# Patient Record
Sex: Female | Born: 1980
Health system: Southern US, Community
[De-identification: ages and names within clinical notes are randomized; demographics above are authoritative.]

## PROBLEM LIST (undated history)

## (undated) DIAGNOSIS — Z8679 Personal history of other diseases of the circulatory system: Secondary | ICD-10-CM

## (undated) DIAGNOSIS — K5900 Constipation, unspecified: Secondary | ICD-10-CM

## (undated) DIAGNOSIS — F419 Anxiety disorder, unspecified: Secondary | ICD-10-CM

## (undated) DIAGNOSIS — Z1509 Genetic susceptibility to other malignant neoplasm: Secondary | ICD-10-CM

## (undated) DIAGNOSIS — I499 Cardiac arrhythmia, unspecified: Secondary | ICD-10-CM

## (undated) DIAGNOSIS — R011 Cardiac murmur, unspecified: Secondary | ICD-10-CM

## (undated) DIAGNOSIS — Z15068 Genetic susceptibility to other malignant neoplasm of digestive system: Secondary | ICD-10-CM

## (undated) DIAGNOSIS — D259 Leiomyoma of uterus, unspecified: Secondary | ICD-10-CM

## (undated) DIAGNOSIS — E785 Hyperlipidemia, unspecified: Secondary | ICD-10-CM

## (undated) DIAGNOSIS — F418 Other specified anxiety disorders: Secondary | ICD-10-CM

## (undated) DIAGNOSIS — G4700 Insomnia, unspecified: Secondary | ICD-10-CM

## (undated) DIAGNOSIS — K5909 Other constipation: Secondary | ICD-10-CM

## (undated) DIAGNOSIS — K649 Unspecified hemorrhoids: Secondary | ICD-10-CM

## (undated) DIAGNOSIS — Z9289 Personal history of other medical treatment: Secondary | ICD-10-CM

## (undated) DIAGNOSIS — N3946 Mixed incontinence: Secondary | ICD-10-CM

## (undated) DIAGNOSIS — I10 Essential (primary) hypertension: Secondary | ICD-10-CM

## (undated) DIAGNOSIS — R7611 Nonspecific reaction to tuberculin skin test without active tuberculosis: Secondary | ICD-10-CM

## (undated) HISTORY — DX: Anxiety disorder, unspecified: F41.9

## (undated) HISTORY — DX: Constipation, unspecified: K59.00

## (undated) HISTORY — DX: Cardiac murmur, unspecified: R01.1

## (undated) HISTORY — DX: Hyperlipidemia, unspecified: E78.5

## (undated) HISTORY — DX: Cardiac arrhythmia, unspecified: I49.9

## (undated) HISTORY — DX: Nonspecific reaction to tuberculin skin test without active tuberculosis: R76.11

## (undated) HISTORY — DX: Essential (primary) hypertension: I10

## (undated) HISTORY — DX: Genetic susceptibility to other malignant neoplasm: Z15.09

---
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
## 2013-08-29 HISTORY — PX: WISDOM TOOTH EXTRACTION: SHX21

## 2017-05-22 MED FILL — AMLODIPINE BESYLATE 10 MG T: 10 | 30 days supply | Qty: 30 | Fill #0

## 2017-06-27 MED FILL — AMLODIPINE BESYLATE 5 MG TA: 5 | 90 days supply | Qty: 90 | Fill #0

## 2017-08-01 ENCOUNTER — Telehealth: Payer: Self-pay | Admitting: General Practice

## 2017-08-01 NOTE — Telephone Encounter (Signed)
Patient has set a requesting on [facility] web site requesting to become a new patient of Dr.Crawford.   Do you accept?

## 2017-08-04 NOTE — Telephone Encounter (Signed)
Fine

## 2017-08-10 NOTE — Telephone Encounter (Signed)
Spoke with patient, she has set up care at the [facility] location.

## 2017-08-25 MED FILL — AMLODIPINE BESYLATE 10 MG T: 10 | 90 days supply | Qty: 90 | Fill #0

## 2017-08-30 ENCOUNTER — Encounter: Payer: Self-pay | Admitting: Family Medicine

## 2017-08-30 ENCOUNTER — Ambulatory Visit (INDEPENDENT_AMBULATORY_CARE_PROVIDER_SITE_OTHER): Payer: No Typology Code available for payment source | Admitting: Family Medicine

## 2017-08-30 VITALS — BP 138/80 | HR 86 | Temp 98.5°F | Resp 12 | Ht 70.0 in | Wt 143.1 lb

## 2017-08-30 DIAGNOSIS — Z0001 Encounter for general adult medical examination with abnormal findings: Secondary | ICD-10-CM

## 2017-08-30 DIAGNOSIS — F418 Other specified anxiety disorders: Secondary | ICD-10-CM

## 2017-08-30 DIAGNOSIS — E785 Hyperlipidemia, unspecified: Secondary | ICD-10-CM | POA: Diagnosis not present

## 2017-08-30 DIAGNOSIS — Z0000 Encounter for general adult medical examination without abnormal findings: Secondary | ICD-10-CM

## 2017-08-30 DIAGNOSIS — Z131 Encounter for screening for diabetes mellitus: Secondary | ICD-10-CM

## 2017-08-30 DIAGNOSIS — H6123 Impacted cerumen, bilateral: Secondary | ICD-10-CM

## 2017-08-30 DIAGNOSIS — I10 Essential (primary) hypertension: Secondary | ICD-10-CM

## 2017-08-30 MED ORDER — AMLODIPINE BESYLATE 10 MG PO TABS: 10.0000 mg | ORAL_TABLET | Freq: Every day | ORAL | 3 refills | Status: DC

## 2017-08-30 NOTE — Patient Instructions (Addendum)
A few things to remember from today's visit:   Diabetes mellitus screening - Plan: Basic metabolic panel  Hyperlipidemia, unspecified hyperlipidemia type - Plan: Lipid panel  Hypertension, essential, benign - Plan: Basic metabolic panel, TSH  Routine general medical examination at a health care facility  Today you have you routine preventive visit.  At least 150 minutes of moderate exercise per week, daily brisk walking for 15-30 min is a good exercise option. Healthy diet low in saturated (animal) fats and sweets and consisting of fresh fruits and vegetables, lean meats such as fish and white chicken and whole grains.  These are some of recommendations for screening depending of age and risk factors:   - Vaccines:  Tdap vaccine every 10 years.  Shingles vaccine recommended at age 39, could be given after 39 years of age but not sure about insurance coverage.   Pneumonia vaccines:  Prevnar 13 at 65 and Pneumovax at 66. Sometimes Pneumovax is giving earlier if history of smoking, lung disease,diabetes,kidney disease among some.    Screening for diabetes at age 39 and every 3 years.  Cervical cancer prevention:  Pap smear starts at 39 years of age and continues periodically until 39 years old in low risk women. Pap smear every 3 years between 5921 and 39 years old. Pap smear every 3-5 years between women 30 and older if pap smear negative and HPV screening negative.   -Breast cancer: Mammogram: There is disagreement between experts about when to start screening in low risk asymptomatic female but recent recommendations are to start screening at 8540 and not later than 39 years old , every 1-2 years and after 39 yo q 2 years. Screening is recommended until 39 years old but some women can continue screening depending of healthy issues.   Colon cancer screening: starts at 39 years old until 39 years old.  Cholesterol disorder screening at age 39 and every 3 years.  Also  recommended:  1. Dental visit- Brush and floss your teeth twice daily; visit your dentist twice a year. 2. Eye doctor- Get an eye exam at least every 2 years. 3. Helmet use- Always wear a helmet when riding a bicycle, motorcycle, rollerblading or skateboarding. 4. Safe sex- If you may be exposed to sexually transmitted infections, use a condom. 5. Seat belts- Seat belts can save your live; always wear one. 6. Smoke/Carbon Monoxide detectors- These detectors need to be installed on the appropriate level of your home. Replace batteries at least once a year. 7. Skin cancer- When out in the sun please cover up and use sunscreen 15 SPF or higher. 8. Violence- If anyone is threatening or hurting you, please tell your healthcare provider.  9. Drink alcohol in moderation- Limit alcohol intake to one drink or less per day. Never drink and drive.   Please be sure medication list is accurate. If a new problem present, please set up appointment sooner than planned today.

## 2017-08-30 NOTE — Progress Notes (Signed)
HPI:   [name] is a 37 y.o. female, who is here today to establish care.  Former PCP: Dr Jaynie CollinsSchwa. She just moved from [place]  She is a Optometristpediatrician, works for [employer].  Last preventive routine visit: 08/2016.  Chronic medical problems: HTN,HLD,and mild anxiety among some.  Reporting Hx of white coat synd.  Dx with HTN 2 years ago after second child. She also has elevated BP after her first child.  156/102 and 118/70. Her former PCP recommended Xanax 0.5 mg 1/2-1 tab to take before visits and she noted that her BP's was better after taking medication.She does not take Xanax regularly. She takes Propranolol for situational anxiety.  Home BP's: 110'120's/80's.  Last eye exam 08/2015.  She has no concerns today and would like to have her routine CPE. She has an appt with gyn in the next few weeks for her female preventive care.  She lives with her husband and 2 children. She does not exercise regularly but planning on doing so. She follows a healthy diet in general.   Review of Systems  Constitutional: Negative for activity change, appetite change, fatigue, fever and unexpected weight change.  HENT: Negative for dental problem, mouth sores, nosebleeds, sore throat and trouble swallowing.   Eyes: Negative for redness and visual disturbance.  Respiratory: Negative for cough, shortness of breath and wheezing.   Cardiovascular: Negative for chest pain, palpitations and leg swelling.  Gastrointestinal: Negative for abdominal pain, blood in stool, nausea and vomiting.       Negative for changes in bowel habits.  Endocrine: Negative for cold intolerance, heat intolerance, polydipsia, polyphagia and polyuria.  Genitourinary: Negative for decreased urine volume, dysuria, hematuria, vaginal bleeding and vaginal discharge.  Musculoskeletal: Negative for gait problem and myalgias.  Skin: Negative for pallor and rash.  Allergic/Immunologic: Negative  for environmental allergies.  Neurological: Negative for syncope, weakness and headaches.  Hematological: Negative for adenopathy. Does not bruise/bleed easily.  Psychiatric/Behavioral: Negative for confusion, sleep disturbance and suicidal ideas. The patient is nervous/anxious.   All other systems reviewed and are negative.     Current Outpatient Medications on File Prior to Visit  Medication Sig Dispense Refill  . propranolol (INDERAL) 20 MG tablet Take 20 mg by mouth as needed.     No current facility-administered medications on file prior to visit.      Past Medical History:  Diagnosis Date  . Hypertension   . Positive TB test    No Known Allergies  Family History  Problem Relation Age of Onset  . Diabetes Mother   . Hyperlipidemia Mother   . Miscarriages / [place] Mother   . Graves' disease Mother   . Hyperlipidemia Father   . Heart disease Father   . Arthritis Father   . Hyperlipidemia Sister   . Alcohol abuse Maternal Grandmother   . Arthritis Paternal Grandmother   . Cancer Paternal Grandmother     Social History   Socioeconomic History  . Marital status: Married    Spouse name: None  . Number of children: 2  . Years of education: None  . Highest education level: None  Social Needs  . Financial resource strain: None  . Food insecurity - worry: None  . Food insecurity - inability: None  . Transportation needs - medical: None  . Transportation needs - non-medical: None  Occupational History  . None  Tobacco Use  . Smoking status: Never Smoker  . Smokeless tobacco: Never Used  Substance and Sexual Activity  . Alcohol use: Yes  . Drug use: No  . Sexual activity: None  Other Topics Concern  . None  Social History Narrative  . None    Vitals:   08/30/17 1406 08/30/17 1454  BP: (!) 146/96 138/80  Pulse: 86   Resp: 12   Temp: 98.5 F (36.9 C)   SpO2: 100%     Body mass index is 20.54 kg/m.   Physical Exam  Nursing note and vitals  reviewed. Constitutional: She is oriented to person, place, and time. She appears well-developed and well-nourished. No distress.  HENT:  Head: Atraumatic.  Right Ear: External ear normal.  Left Ear: External ear normal.  Mouth/Throat: Uvula is midline, oropharynx is clear and moist and mucous membranes are normal.  Cerumen excess able to see TMs.  Eyes: Conjunctivae and EOM are normal. Pupils are equal, round, and reactive to light.  Neck: No tracheal deviation present. No thyroid mass and no thyromegaly present.  Cardiovascular: Normal rate and regular rhythm.  No murmur heard. Pulses:      Dorsalis pedis pulses are 2+ on the right side, and 2+ on the left side.  Respiratory: Effort normal and breath sounds normal. No respiratory distress.  GI: Soft. She exhibits no mass. There is no hepatomegaly. There is no tenderness.  Genitourinary:  Genitourinary Comments: Deferred to gyn.  Musculoskeletal: She exhibits no edema or tenderness.  Lymphadenopathy:    She has no cervical adenopathy.       Right: No supraclavicular adenopathy present.       Left: No supraclavicular adenopathy present.  Neurological: She is alert and oriented to person, place, and time. She has normal strength. No cranial nerve deficit or sensory deficit. Coordination and gait normal.  Reflex Scores:      Bicep reflexes are 2+ on the right side and 2+ on the left side.      Patellar reflexes are 2+ on the right side and 2+ on the left side. Skin: Skin is warm. No rash noted. No erythema.  Psychiatric: She has a normal mood and affect. Cognition and memory are normal.  Well groomed, good eye contact.     ASSESSMENT AND PLAN:   Dr Sherryll Burger was seen today for establish care and annual exam.  Diagnoses and all orders for this visit:  Routine general medical examination at a health care facility  We discussed the importance of regular physical activity and healthy diet for prevention of chronic illness and/or  complications. Preventive guidelines reviewed. She will continue following with gyn for her female preventive care.  Vaccination reported as up to date. CPE in a year.  Diabetes mellitus screening -     Basic metabolic panel; Future  Hyperlipidemia, unspecified hyperlipidemia type  Future labs placed. Continue low fat diet. Further recommendations will be given according to lab results.  -     Lipid panel; Future  Hypertension, essential, benign  Re-checked and improved. Based on home BP's problem seems adequately controlled. Goal < 130/80. No changes in current management. DASH diet recommended. Eye exam recommended annually. F/U in 12 months, before if needed.  -     Basic metabolic panel; Future -     TSH; Future -     amLODipine (NORVASC) 10 MG tablet; Take 1 tablet (10 mg total) by mouth daily.  Impacted cerumen, bilateral  Asymptomatic. Tried to remove cerumen with curette , unsuccessfully. OTC Debrox recommended.  Situational anxiety  Stable. She does not feel  like daily anxiolytic medication is necessary. No changes in current management,Propranolol. F/U in 12 months, earlier if she decides to continue Xanax.     Jerrik Housholder G. [place], MD  [facility]. [facility].

## 2017-09-03 ENCOUNTER — Ambulatory Visit (INDEPENDENT_AMBULATORY_CARE_PROVIDER_SITE_OTHER): Payer: Self-pay | Admitting: Family Medicine

## 2017-09-03 VITALS — BP 130/84 | HR 85 | Temp 98.4°F | Resp 16 | Wt 144.6 lb

## 2017-09-03 DIAGNOSIS — H6123 Impacted cerumen, bilateral: Secondary | ICD-10-CM

## 2017-09-03 NOTE — Patient Instructions (Signed)
Earwax Buildup, Adult The ears produce a substance called earwax that helps keep bacteria out of the ear and protects the skin in the ear canal. Occasionally, earwax can build up in the ear and cause discomfort or hearing loss. What increases the risk? This condition is more likely to develop in people who:  Are female.  Are elderly.  Naturally produce more earwax.  Clean their ears often with cotton swabs.  Use earplugs often.  Use in-ear headphones often.  Wear hearing aids.  Have narrow ear canals.  Have earwax that is overly thick or sticky.  Have eczema.  Are dehydrated.  Have excess hair in the ear canal.  What are the signs or symptoms? Symptoms of this condition include:  Reduced or muffled hearing.  A feeling of fullness in the ear or feeling that the ear is plugged.  Fluid coming from the ear.  Ear pain.  Ear itch.  Ringing in the ear.  Coughing.  An obvious piece of earwax that can be seen inside the ear canal.  How is this diagnosed? This condition may be diagnosed based on:  Your symptoms.  Your medical history.  An ear exam. During the exam, your health care provider will look into your ear with an instrument called an otoscope.  You may have tests, including a hearing test. How is this treated? This condition may be treated by:  Using ear drops to soften the earwax.  Having the earwax removed by a health care provider. The health care provider may: ? Flush the ear with water. ? Use an instrument that has a loop on the end (curette). ? Use a suction device.  Surgery to remove the wax buildup. This may be done in severe cases.  Follow these instructions at home:  Take over-the-counter and prescription medicines only as told by your health care provider.  Do not put any objects, including cotton swabs, into your ear. You can clean the opening of your ear canal with a washcloth or facial tissue.  Follow instructions from your health  care provider about cleaning your ears. Do not over-clean your ears.  Drink enough fluid to keep your urine clear or pale yellow. This will help to thin the earwax.  Keep all follow-up visits as told by your health care provider. If earwax builds up in your ears often or if you use hearing aids, consider seeing your health care provider for routine, preventive ear cleanings. Ask your health care provider how often you should schedule your cleanings.  If you have hearing aids, clean them according to instructions from the manufacturer and your health care provider. Contact a health care provider if:  You have ear pain.  You develop a fever.  You have blood, pus, or other fluid coming from your ear.  You have hearing loss.  You have ringing in your ears that does not go away.  Your symptoms do not improve with treatment.  You feel like the room is spinning (vertigo). Summary  Earwax can build up in the ear and cause discomfort or hearing loss.  The most common symptoms of this condition include reduced or muffled hearing and a feeling of fullness in the ear or feeling that the ear is plugged.  This condition may be diagnosed based on your symptoms, your medical history, and an ear exam.  This condition may be treated by using ear drops to soften the earwax or by having the earwax removed by a health care provider.  Do   not put any objects, including cotton swabs, into your ear. You can clean the opening of your ear canal with a washcloth or facial tissue. This information is not intended to replace advice given to you by your health care provider. Make sure you discuss any questions you have with your health care provider. Document Released: 09/22/2004 Document Revised: 10/26/2016 Document Reviewed: 10/26/2016 Elsevier Interactive Patient Education  2018 Elsevier Inc.  

## 2017-09-03 NOTE — Progress Notes (Signed)
Subjective:  Desiree Campbell is a [age] y.o. female who presents with a complaint of bilateral ear clogging. Patient denies any current health related concerns. Recently presented to her PCP earlier in the week and was prescribed  Debrox drops to loosen thicken cerumen. She used Debrox drops over the course of two days and now reports a clogging sensation in her right ear which is causing hearing loss. Left ear has thicken cerumen as well, however she is not experiencing any hearing loss or pain to the left ear.    Past Medical History:  Diagnosis Date  . Hypertension   . Positive TB test     No past surgical history on file.  Social History   Tobacco Use  . Smoking status: Never Smoker  . Smokeless tobacco: Never Used  Substance Use Topics  . Alcohol use: Yes  . Drug use: No    No Known Allergies  Current Outpatient Medications  Medication Sig Dispense Refill  . amLODipine (NORVASC) 10 MG tablet Take 1 tablet (10 mg total) by mouth daily. 90 tablet 3  . propranolol (INDERAL) 20 MG tablet Take 20 mg by mouth as needed.     No current facility-administered medications for this visit.     Review of Systems  Constitutional: Negative.   HENT: Positive for ear discharge and hearing loss. Negative for ear pain and tinnitus.   Respiratory: Negative.   Cardiovascular: Negative.     BP 130/84 (BP Location: Right Arm, Patient Position: Sitting, Cuff Size: Large)   Pulse 85   Temp 98.4 F (36.9 C) (Oral)   Resp 16   Wt 144 lb 9.6 oz (65.6 kg)   SpO2 100%   BMI 20.75 kg/m    Objective:  Physical Exam  Constitutional: She is well-developed, well-nourished, and in no distress.  HENT:  Right Ear: No tenderness. Decreased hearing is noted.  Left Ear: No tenderness. Decreased hearing is noted.  Right ear thick, brown, cerumen occluding the TM  Left thick brown cerumen extending into the ear canal.     Assessment:  Cerumen Impaction, bilateral.    Plan:    Patient education provided.   Patient will follow up with PCP if cerumen build-up reoccurs.   Orders Placed This Encounter  Procedures  . Ear wax removal    Godfrey PickKimberly S. Tiburcio PeaHarris, MSN, FNP-C [id number]  [place], [place] [id number] [phone]

## 2017-09-08 ENCOUNTER — Other Ambulatory Visit (INDEPENDENT_AMBULATORY_CARE_PROVIDER_SITE_OTHER): Payer: No Typology Code available for payment source

## 2017-09-08 DIAGNOSIS — I10 Essential (primary) hypertension: Secondary | ICD-10-CM

## 2017-09-08 DIAGNOSIS — Z131 Encounter for screening for diabetes mellitus: Secondary | ICD-10-CM

## 2017-09-08 DIAGNOSIS — E785 Hyperlipidemia, unspecified: Secondary | ICD-10-CM

## 2017-09-08 LAB — BASIC METABOLIC PANEL
BUN: 8 mg/dL (ref 6–23)
CHLORIDE: 100 meq/L (ref 96–112)
CO2: 29 mEq/L (ref 19–32)
CREATININE: 0.79 mg/dL (ref 0.40–1.20)
Calcium: 9.5 mg/dL (ref 8.4–10.5)
GFR: 104.24 mL/min (ref >60.00)
Glucose, Bld: 91 mg/dL (ref 70–99)
POTASSIUM: 3.9 meq/L (ref 3.5–5.1)
Sodium: 137 mEq/L (ref 135–145)

## 2017-09-08 LAB — LIPID PANEL
CHOLESTEROL: 225 mg/dL — AB (ref 0–200)
HDL: 68.3 mg/dL (ref >39.00)
LDL CALC: 140 mg/dL — AB (ref 0–99)
NonHDL: 156.87
Total CHOL/HDL Ratio: 3
Triglycerides: 86 mg/dL (ref 0.0–149.0)
VLDL: 17.2 mg/dL (ref 0.0–40.0)

## 2017-09-08 LAB — TSH: TSH: 1.46 u[IU]/mL (ref 0.35–4.50)

## 2017-09-20 ENCOUNTER — Encounter: Payer: Self-pay | Admitting: Family Medicine

## 2017-11-08 ENCOUNTER — Encounter: Payer: Self-pay | Admitting: Family Medicine

## 2017-11-10 ENCOUNTER — Other Ambulatory Visit: Payer: Self-pay | Admitting: *Deleted

## 2017-11-10 ENCOUNTER — Telehealth: Payer: Self-pay | Admitting: Family Medicine

## 2017-11-10 DIAGNOSIS — F418 Other specified anxiety disorders: Secondary | ICD-10-CM

## 2017-11-10 MED ORDER — PROPRANOLOL HCL 20 MG PO TABS: 20.0000 mg | ORAL_TABLET | ORAL | 1 refills | Status: DC | PRN

## 2017-11-10 MED FILL — PROPRANOLOL 20 MG TABLET: 20 | 30 days supply | Qty: 30 | Fill #0

## 2017-11-10 NOTE — Telephone Encounter (Signed)
Spoke with pharmacy, gave directions for medication.

## 2017-11-10 NOTE — Telephone Encounter (Signed)
Copied from CRM [id number]. Topic: Quick Communication - Rx Refill/Question >> Nov 10, 2017 10:50 AM Desiree Campbell, Rosey Batheresa D wrote: Medication: Propranolol HCl  Has the patient contacted their pharmacy? Yes, pharmacy needs to know how patient needs to take if 1 or 2 times a day/daily? (Agent: If no, request that the patient contact the pharmacy for the refill.) Preferred Pharmacy (with phone number or street name):[facility] [place] - [address] Agent: Please be advised that RX refills may take up to 3 business days. We ask that you follow-up with your pharmacy.

## 2017-11-16 MED FILL — AMLODIPINE BESYLATE 10 MG T: 10 | 90 days supply | Qty: 90 | Fill #1

## 2018-03-12 MED FILL — AMLODIPINE BESYLATE 10 MG T: 10 | 90 days supply | Qty: 90 | Fill #2

## 2018-07-16 MED FILL — PROPRANOLOL 20 MG TABLET: 20 | 30 days supply | Qty: 30 | Fill #1

## 2018-07-16 MED FILL — AMLODIPINE BESYLATE 10 MG T: 10 | 90 days supply | Qty: 90 | Fill #3

## 2018-09-11 ENCOUNTER — Encounter: Payer: Self-pay | Admitting: Family Medicine

## 2018-09-11 ENCOUNTER — Ambulatory Visit (INDEPENDENT_AMBULATORY_CARE_PROVIDER_SITE_OTHER): Payer: No Typology Code available for payment source | Admitting: Family Medicine

## 2018-09-11 VITALS — BP 124/70 | HR 84 | Temp 98.1°F | Resp 12 | Ht 70.0 in | Wt 137.2 lb

## 2018-09-11 DIAGNOSIS — Z0000 Encounter for general adult medical examination without abnormal findings: Secondary | ICD-10-CM

## 2018-09-11 DIAGNOSIS — I10 Essential (primary) hypertension: Secondary | ICD-10-CM | POA: Diagnosis not present

## 2018-09-11 DIAGNOSIS — E785 Hyperlipidemia, unspecified: Secondary | ICD-10-CM | POA: Diagnosis not present

## 2018-09-11 DIAGNOSIS — Z131 Encounter for screening for diabetes mellitus: Secondary | ICD-10-CM

## 2018-09-11 LAB — BASIC METABOLIC PANEL
BUN: 8 mg/dL (ref 6–23)
CO2: 30 mEq/L (ref 19–32)
Calcium: 10 mg/dL (ref 8.4–10.5)
Chloride: 99 mEq/L (ref 96–112)
Creatinine, Ser: 0.71 mg/dL (ref 0.40–1.20)
GFR: 117.3 mL/min (ref >60.00)
Glucose, Bld: 80 mg/dL (ref 70–99)
Potassium: 4.2 mEq/L (ref 3.5–5.1)
SODIUM: 136 meq/L (ref 135–145)

## 2018-09-11 LAB — LIPID PANEL
Cholesterol: 248 mg/dL — ABNORMAL HIGH (ref 0–200)
HDL: 72.6 mg/dL (ref >39.00)
LDL CALC: 156 mg/dL — AB (ref 0–99)
NonHDL: 175.76
Total CHOL/HDL Ratio: 3
Triglycerides: 98 mg/dL (ref 0.0–149.0)
VLDL: 19.6 mg/dL (ref 0.0–40.0)

## 2018-09-11 NOTE — Progress Notes (Signed)
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 Last CPE: 08/30/17  Regular exercise 3 or more time per week: not consistently. She is planning on participating on Kayaking trip, she will start training. So she needs a form completed. Following a healthy diet: Yes. She lives with her husband and 2 children.  Chronic medical problems: Anxiety, hyperlipidemia, and hypertension.  HTN on Amlodipine 10 mg daily. Checking BP at home, 120's/80's. Situational anxiety on Propranolol 20 mg bid,she takes this med occasional. She is not taking Xanax. HLD, she is on non pharmacologic treatment. Last FLP 09/08/2017: TC 225,TG 86,HDL 63,and LDL 140.  Pap smear 09/2017,she follows with ehr gyn annually.   There is no immunization history on file for this patient.  Mammogram: N/A  She has no concerns today.   Review of Systems  Constitutional: Negative for appetite change, fatigue and fever.  HENT: Negative for dental problem, hearing loss, mouth sores, sore throat, trouble swallowing and voice change.   Eyes: Negative for redness and visual disturbance.  Respiratory: Negative for cough, shortness of breath and wheezing.   Cardiovascular: Negative for chest pain and leg swelling.  Gastrointestinal: Negative for abdominal pain, nausea and vomiting.       No changes in bowel habits.  Endocrine: Negative for cold intolerance, heat intolerance, polydipsia, polyphagia and polyuria.  Genitourinary: Negative for decreased urine volume, dysuria, hematuria, vaginal bleeding and vaginal discharge.  Musculoskeletal: Negative for arthralgias, gait problem and myalgias.  Skin: Negative for color change and rash.  Allergic/Immunologic: Negative for environmental allergies.  Neurological: Negative for syncope, weakness and headaches.  Hematological: Negative for adenopathy. Does not bruise/bleed easily.    Psychiatric/Behavioral: Negative for confusion and sleep disturbance. The patient is nervous/anxious.   All other systems reviewed and are negative.    Current Outpatient Medications on File Prior to Visit  Medication Sig Dispense Refill  . amLODipine (NORVASC) 10 MG tablet Take 1 tablet (10 mg total) by mouth daily. 90 tablet 3  . propranolol (INDERAL) 20 MG tablet Take 1 tablet (20 mg total) by mouth as needed. 30 tablet 1   No current facility-administered medications on file prior to visit.      Past Medical History:  Diagnosis Date  . Hypertension   . Positive TB test     History reviewed. No pertinent surgical history.  No Known Allergies  Family History  Problem Relation Age of Onset  . Diabetes Mother   . Hyperlipidemia Mother   . Miscarriages / [place] Mother   . Graves' disease Mother   . Hyperlipidemia Father   . Heart disease Father   . Arthritis Father   . Hyperlipidemia Sister   . Alcohol abuse Maternal Grandmother   . Arthritis Paternal Grandmother   . Cancer Paternal Grandmother     Social History   Socioeconomic History  . Marital status: Married    Spouse name: Not on file  . Number of children: 2  . Years of education: Not on file  . Highest education level: Not on file  Occupational History  . Not on file  Social Needs  . Financial resource strain: Not on file  . Food insecurity:    Worry: Not on file    Inability: Not on file  . Transportation needs:    Medical: Not on file    Non-medical: Not on file  Tobacco Use  . Smoking  status: Never Smoker  . Smokeless tobacco: Never Used  Substance and Sexual Activity  . Alcohol use: Yes  . Drug use: No  . Sexual activity: Not on file  Lifestyle  . Physical activity:    Days per week: Not on file    Minutes per session: Not on file  . Stress: Not on file  Relationships  . Social connections:    Talks on phone: Not on file    Gets together: Not on file    Attends religious  service: Not on file    Active member of club or organization: Not on file    Attends meetings of clubs or organizations: Not on file    Relationship status: Not on file  Other Topics Concern  . Not on file  Social History Narrative  . Not on file     Vitals:   09/11/18 1131  BP: 124/70  Pulse: 84  Resp: 12  Temp: 98.1 F (36.7 C)  SpO2: 99%   Body mass index is 19.69 kg/m.   Wt Readings from Last 3 Encounters:  09/11/18 137 lb 4 oz (62.3 kg)  09/03/17 144 lb 9.6 oz (65.6 kg)  08/30/17 143 lb 2 oz (64.9 kg)     Physical Exam  Nursing note and vitals reviewed. Constitutional: She is oriented to person, place, and time. She appears well-developed and well-nourished. No distress.  HENT:  Head: Normocephalic and atraumatic.  Right Ear: Hearing and external ear normal.  Left Ear: Hearing, tympanic membrane, external ear and ear canal normal.  Mouth/Throat: Uvula is midline, oropharynx is clear and moist and mucous membranes are normal.  Right ear canal cerumen excess,TM not visible.  Eyes: Pupils are equal, round, and reactive to light. Conjunctivae and EOM are normal.  Neck: No tracheal deviation present. No thyromegaly present.  Cardiovascular: Normal rate and regular rhythm.  No murmur heard. Pulses:      Dorsalis pedis pulses are 2+ on the right side and 2+ on the left side.  Respiratory: Effort normal and breath sounds normal. No respiratory distress.  GI: Soft. She exhibits no mass. There is no hepatomegaly. There is no abdominal tenderness.  Genitourinary:    Genitourinary Comments: Deferred to gyn.   Musculoskeletal:        General: No edema.     Comments: No major deformity or signs of synovitis appreciated.  Lymphadenopathy:    She has no cervical adenopathy.       Right: No supraclavicular adenopathy present.       Left: No supraclavicular adenopathy present.  Neurological: She is alert and oriented to person, place, and time. She has normal strength. No  cranial nerve deficit. Coordination and gait normal.  Reflex Scores:      Bicep reflexes are 2+ on the right side and 2+ on the left side.      Patellar reflexes are 2+ on the right side and 2+ on the left side. Skin: Skin is warm. No rash noted. No erythema.  Psychiatric: She has a normal mood and affect. Cognition and memory are normal.  Well groomed, good eye contact.    ASSESSMENT AND PLAN:  Ms. Desiree Campbell was here today annual physical examination.   Orders Placed This Encounter  Procedures  . Basic metabolic panel  . Lipid panel   Lab Results  Component Value Date   CHOL 248 (H) 09/11/2018   HDL 72.60 09/11/2018   LDLCALC 156 (H) 09/11/2018   TRIG 98.0 09/11/2018  CHOLHDL 3 09/11/2018   Lab Results  Component Value Date   CREATININE 0.71 09/11/2018   BUN 8 09/11/2018   NA 136 09/11/2018   K 4.2 09/11/2018   CL 99 09/11/2018   CO2 30 09/11/2018    Routine general medical examination at a health care facility We discussed the importance of regular physical activity and healthy diet for prevention of chronic illness and/or complications. Preventive guidelines reviewed. Vaccination up to date. She will continue following with her gyn for her female preventive care. Next CPE in a year.  Hyperlipidemia, unspecified hyperlipidemia type Continue non pharmacologic treatment. Further recommendations according to lipid panel results.  - Lipid panel  Diabetes mellitus screening - Basic metabolic panel  Hypertension, essential, benign Re-checked 125/85. Adequately controlled. No changes in current management. Low salt diet to continue. Eye exam annually.   Kayaking trip form completed.    Return in about 1 year (around 09/12/2019) for cpe.    Cartez Mogle G. [place], MD  [facility]. [place] office.

## 2018-09-11 NOTE — Patient Instructions (Addendum)
A few things to remember from today's visit:   Routine general medical examination at a health care facility  Hyperlipidemia, unspecified hyperlipidemia type - Plan: Basic metabolic panel, Lipid panel  Today you have you routine preventive visit.  At least 150 minutes of moderate exercise per week, daily brisk walking for 15-30 min is a good exercise option. Healthy diet low in saturated (animal) fats and sweets and consisting of fresh fruits and vegetables, lean meats such as fish and white chicken and whole grains.  These are some of recommendations for screening depending of age and risk factors:   - Vaccines:  Tdap vaccine every 10 years.  Shingles vaccine recommended at age 81, could be given after 40 years of age but not sure about insurance coverage.   Pneumonia vaccines:  Prevnar 13 at 65 and Pneumovax at 66. Sometimes Pneumovax is giving earlier if history of smoking, lung disease,diabetes,kidney disease among some.    Screening for diabetes at age 42 and every 3 years.  Cervical cancer prevention:  Pap smear starts at 40 years of age and continues periodically until 40 years old in low risk women. Pap smear every 3 years between 52 and 58 years old. Pap smear every 3-5 years between women 30 and older if pap smear negative and HPV screening negative.   -Breast cancer: Mammogram: There is disagreement between experts about when to start screening in low risk asymptomatic female but recent recommendations are to start screening at 79 and not later than 40 years old , every 1-2 years and after 40 yo q 2 years. Screening is recommended until 40 years old but some women can continue screening depending of healthy issues.   Colon cancer screening: starts at 40 years old until 40 years old.   Also recommended:  1. Dental visit- Brush and floss your teeth twice daily; visit your dentist twice a year. 2. Eye doctor- Get an eye exam at least every 2 years. 3. Helmet use-  Always wear a helmet when riding a bicycle, motorcycle, rollerblading or skateboarding. 4. Safe sex- If you may be exposed to sexually transmitted infections, use a condom. 5. Seat belts- Seat belts can save your live; always wear one. 6. Smoke/Carbon Monoxide detectors- These detectors need to be installed on the appropriate level of your home. Replace batteries at least once a year. 7. Skin cancer- When out in the sun please cover up and use sunscreen 15 SPF or higher. 8. Violence- If anyone is threatening or hurting you, please tell your healthcare provider.  9. Drink alcohol in moderation- Limit alcohol intake to one drink or less per day. Never drink and drive.  Please be sure medication list is accurate. If a new problem present, please set up appointment sooner than planned today.

## 2018-09-14 ENCOUNTER — Encounter: Payer: No Typology Code available for payment source | Admitting: Family Medicine

## 2018-09-15 ENCOUNTER — Encounter: Payer: Self-pay | Admitting: Family Medicine

## 2018-11-04 ENCOUNTER — Encounter: Payer: Self-pay | Admitting: Family Medicine

## 2018-11-05 ENCOUNTER — Other Ambulatory Visit: Payer: Self-pay | Admitting: *Deleted

## 2018-11-05 DIAGNOSIS — I10 Essential (primary) hypertension: Secondary | ICD-10-CM

## 2018-11-05 MED ORDER — AMLODIPINE BESYLATE 10 MG PO TABS: 10.0000 mg | ORAL_TABLET | Freq: Every day | ORAL | 3 refills | Status: DC

## 2018-11-05 MED FILL — AMLODIPINE BESYLATE 10 MG T: 10 | 90 days supply | Qty: 90 | Fill #0

## 2019-02-22 ENCOUNTER — Other Ambulatory Visit: Payer: Self-pay | Admitting: Family Medicine

## 2019-02-22 ENCOUNTER — Encounter: Payer: Self-pay | Admitting: Family Medicine

## 2019-02-22 DIAGNOSIS — F418 Other specified anxiety disorders: Secondary | ICD-10-CM

## 2019-02-22 MED FILL — AMLODIPINE BESYLATE 10 MG T: 10 | 90 days supply | Qty: 90 | Fill #1

## 2019-02-22 MED FILL — PROPRANOLOL 20 MG TABLET: 20 | 30 days supply | Qty: 30 | Fill #0

## 2019-06-10 MED FILL — AMLODIPINE BESYLATE 10 MG T: 10 | 90 days supply | Qty: 90 | Fill #2

## 2019-09-27 MED FILL — PROPRANOLOL 20 MG TABLET: 20 | 30 days supply | Qty: 30 | Fill #1

## 2019-09-27 MED FILL — AMLODIPINE BESYLATE 10 MG T: 10 | 90 days supply | Qty: 90 | Fill #3

## 2019-11-12 ENCOUNTER — Other Ambulatory Visit: Payer: Self-pay

## 2019-11-13 ENCOUNTER — Encounter: Payer: Self-pay | Admitting: Family Medicine

## 2019-11-13 ENCOUNTER — Ambulatory Visit (INDEPENDENT_AMBULATORY_CARE_PROVIDER_SITE_OTHER): Payer: No Typology Code available for payment source | Admitting: Family Medicine

## 2019-11-13 VITALS — BP 120/70 | HR 68 | Resp 12 | Ht 70.0 in | Wt 143.0 lb

## 2019-11-13 DIAGNOSIS — Z0000 Encounter for general adult medical examination without abnormal findings: Secondary | ICD-10-CM

## 2019-11-13 DIAGNOSIS — Z1329 Encounter for screening for other suspected endocrine disorder: Secondary | ICD-10-CM | POA: Diagnosis not present

## 2019-11-13 DIAGNOSIS — E785 Hyperlipidemia, unspecified: Secondary | ICD-10-CM | POA: Diagnosis not present

## 2019-11-13 DIAGNOSIS — Z13228 Encounter for screening for other metabolic disorders: Secondary | ICD-10-CM

## 2019-11-13 DIAGNOSIS — Z130 Encounter for screening for diseases of the blood and blood-forming organs and certain disorders involving the immune mechanism: Secondary | ICD-10-CM | POA: Diagnosis not present

## 2019-11-13 DIAGNOSIS — I10 Essential (primary) hypertension: Secondary | ICD-10-CM

## 2019-11-13 LAB — LIPID PANEL
Cholesterol: 236 mg/dL — ABNORMAL HIGH (ref 0–200)
HDL: 66.7 mg/dL (ref >39.00)
LDL Cholesterol: 156 mg/dL — ABNORMAL HIGH (ref 0–99)
NonHDL: 169.08
Total CHOL/HDL Ratio: 4
Triglycerides: 64 mg/dL (ref 0.0–149.0)
VLDL: 12.8 mg/dL (ref 0.0–40.0)

## 2019-11-13 LAB — BASIC METABOLIC PANEL
BUN: 9 mg/dL (ref 6–23)
CO2: 31 mEq/L (ref 19–32)
Calcium: 10 mg/dL (ref 8.4–10.5)
Chloride: 100 mEq/L (ref 96–112)
Creatinine, Ser: 0.83 mg/dL (ref 0.40–1.20)
GFR: 91.62 mL/min (ref >60.00)
Glucose, Bld: 78 mg/dL (ref 70–99)
Potassium: 4 mEq/L (ref 3.5–5.1)
Sodium: 136 mEq/L (ref 135–145)

## 2019-11-13 MED ORDER — AMLODIPINE BESYLATE 10 MG PO TABS: 10.0000 mg | ORAL_TABLET | Freq: Every day | ORAL | 3 refills | Status: DC

## 2019-11-13 NOTE — Patient Instructions (Signed)
A few things to remember from today's visit:  Today you have you routine preventive visit.  At least 150 minutes of moderate exercise per week, daily brisk walking for 15-30 min is a good exercise option. Healthy diet low in saturated (animal) fats and sweets and consisting of fresh fruits and vegetables, lean meats such as fish and white chicken and whole grains.  These are some of recommendations for screening depending of age and risk factors:  - Vaccines:  Tdap vaccine every 10 years.  Shingles vaccine recommended at age 94, could be given after 41 years of age but not sure about insurance coverage.   Pneumonia vaccines: Pneumovax at 65. Sometimes Pneumovax is giving earlier if history of smoking, lung disease,diabetes,kidney disease among some.  Screening for diabetes at age 4 and every 3 years.  Cervical cancer prevention:  Pap smear starts at 41 years of age and continues periodically until 41 years old in low risk women. Pap smear every 3 years between 47 and 54 years old. Pap smear every 3-5 years between women 30 and older if pap smear negative and HPV screening negative.   -Breast cancer: Mammogram: There is disagreement between experts about when to start screening in low risk asymptomatic female but recent recommendations are to start screening at 7 and not later than 41 years old , every 1-2 years and after 41 yo q 2 years. Screening is recommended until 41 years old but some women can continue screening depending of healthy issues.  Colon cancer screening: starts at 41 years old until 41 years old.  Cholesterol disorder screening: N/A  Also recommended:  1. Dental visit- Brush and floss your teeth twice daily; visit your dentist twice a year. 2. Eye doctor- Get an eye exam at least every 2 years. 3. Helmet use- Always wear a helmet when riding a bicycle, motorcycle, rollerblading or skateboarding. 4. Safe sex- If you may be exposed to sexually transmitted  infections, use a condom. 5. Seat belts- Seat belts can save your live; always wear one. 6. Smoke/Carbon Monoxide detectors- These detectors need to be installed on the appropriate level of your home. Replace batteries at least once a year. 7. Skin cancer- When out in the sun please cover up and use sunscreen 15 SPF or higher. 8. Violence- If anyone is threatening or hurting you, please tell your healthcare provider.  9. Drink alcohol in moderation- Limit alcohol intake to one drink or less per day. Never drink and drive.

## 2019-11-13 NOTE — Assessment & Plan Note (Signed)
BP adequately controlled. Continue amlodipine 10 mg daily and low-salt diet. Continue monitoring BP regularly. F/U in a year.

## 2019-11-13 NOTE — Assessment & Plan Note (Signed)
Continue nonpharmacologic treatment. Further recommendation will be given according to 10-year CVD score and lipid panel numbers. Follow-up in 1 year.

## 2019-11-13 NOTE — Progress Notes (Signed)
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  Last CPE: 09/11/2018.  Regular exercise 3 or more time per week: Not consistently. Following a healthy diet: Yes. She gained some weigh since COVID-19 pandemic started.  She lives with her husband and 2 children.  Chronic medical problems: Hypertension and hyperlipidemia.  Pap smear: 08/2018  There is no immunization history on file for this patient.  Mammogram: Planning on having her first one during gyn visit. Colonoscopy: N/A DEXA: N/A  HTN: Currently she is on amlodipine 10 mg daily. Tolerating medication well. Home BP readings 120s/70s.  Lab Results  Component Value Date   CREATININE 0.71 09/11/2018   BUN 8 09/11/2018   NA 136 09/11/2018   K 4.2 09/11/2018   CL 99 09/11/2018   CO2 30 09/11/2018   Hyperlipidemia: Currently she is on no pharmacologic treatment.  Lab Results  Component Value Date   CHOL 248 (H) 09/11/2018   HDL 72.60 09/11/2018   LDLCALC 156 (H) 09/11/2018   TRIG 98.0 09/11/2018   CHOLHDL 3 09/11/2018    She has no concerns today.   Review of Systems  Constitutional: Negative for appetite change, fatigue and fever.  HENT: Negative for dental problem, hearing loss, mouth sores and sore throat.   Eyes: Negative for redness and visual disturbance.  Respiratory: Negative for cough, shortness of breath and wheezing.   Cardiovascular: Negative for chest pain and leg swelling.  Gastrointestinal: Negative for abdominal pain, nausea and vomiting.       No changes in bowel habits.  Endocrine: Negative for cold intolerance, heat intolerance, polydipsia, polyphagia and polyuria.  Genitourinary: Negative for decreased urine volume, dysuria, hematuria, vaginal bleeding and vaginal discharge.  Musculoskeletal: Negative for gait problem and myalgias.  Skin: Negative for color change and rash.  Allergic/Immunologic: Negative for environmental  allergies.  Neurological: Negative for syncope, weakness and headaches.  Hematological: Negative for adenopathy. Does not bruise/bleed easily.  Psychiatric/Behavioral: Negative for confusion and sleep disturbance.  All other systems reviewed and are negative.  Current Outpatient Medications on File Prior to Visit  Medication Sig Dispense Refill  . propranolol (INDERAL) 20 MG tablet TAKE 1 TABLET BY MOUTH ONCE DAILY AS NEEDED 30 tablet 1   No current facility-administered medications on file prior to visit.   Past Medical History:  Diagnosis Date  . Hypertension   . Positive TB test    History reviewed. No pertinent surgical history.  No Known Allergies  Family History  Problem Relation Age of Onset  . Diabetes Mother   . Hyperlipidemia Mother   . Miscarriages / [place] Mother   . Graves' disease Mother   . Hyperlipidemia Father   . Heart disease Father   . Arthritis Father   . Hyperlipidemia Sister   . Alcohol abuse Maternal Grandmother   . Arthritis Paternal Grandmother   . Cancer Paternal Grandmother     Social History   Socioeconomic History  . Marital status: Married    Spouse name: Not on file  . Number of children: 2  . Years of education: Not on file  . Highest education level: Not on file  Occupational History  . Not on file  Tobacco Use  . Smoking status: Never Smoker  . Smokeless tobacco: Never Used  Substance and Sexual Activity  . Alcohol use: Yes  . Drug use: No  . Sexual activity: Not on file  Other Topics Concern  .  Not on file  Social History Narrative  . Not on file   Social Determinants of Health   Financial Resource Strain:   . Difficulty of Paying Living Expenses:   Food Insecurity:   . Worried About Charity fundraiser in the Last Year:   . Arboriculturist in the Last Year:   Transportation Needs:   . Film/video editor (Medical):   Marland Kitchen Lack of Transportation (Non-Medical):   Physical Activity:   . Days of Exercise per  Week:   . Minutes of Exercise per Session:   Stress:   . Feeling of Stress :   Social Connections:   . Frequency of Communication with Friends and Family:   . Frequency of Social Gatherings with Friends and Family:   . Attends Religious Services:   . Active Member of Clubs or Organizations:   . Attends Archivist Meetings:   Marland Kitchen Marital Status:    Vitals:   11/13/19 0934  BP: 120/70  Pulse: 68  Resp: 12  SpO2: 98%   Body mass index is 20.52 kg/m.  Wt Readings from Last 3 Encounters:  11/13/19 143 lb (64.9 kg)  09/11/18 137 lb 4 oz (62.3 kg)  09/03/17 144 lb 9.6 oz (65.6 kg)    Physical Exam  Nursing note and vitals reviewed. Constitutional: She is oriented to person, place, and time. She appears well-developed and well-nourished. No distress.  HENT:  Head: Normocephalic and atraumatic.  Right Ear: Hearing and external ear normal.  Left Ear: Hearing, tympanic membrane, external ear and ear canal normal.  Mouth/Throat: Uvula is midline, oropharynx is clear and moist and mucous membranes are normal.  Excess cerumen R>L, I cannot see right TM.  Eyes: Pupils are equal, round, and reactive to light. Conjunctivae and EOM are normal.  Neck: No tracheal deviation present. No thyromegaly present.  Cardiovascular: Normal rate and regular rhythm.  No murmur heard. Pulses:      Dorsalis pedis pulses are 2+ on the right side and 2+ on the left side.  Respiratory: Effort normal and breath sounds normal. No respiratory distress.  GI: Soft. She exhibits no mass. There is no hepatomegaly. There is no abdominal tenderness.  Genitourinary:    Genitourinary Comments: Deferred to gyn.   Musculoskeletal:        General: No edema.     Comments: No major deformity or signs of synovitis appreciated.  Lymphadenopathy:    She has no cervical adenopathy.       Right: No supraclavicular adenopathy present.       Left: No supraclavicular adenopathy present.  Neurological: She is alert  and oriented to person, place, and time. She has normal strength. No cranial nerve deficit. Coordination and gait normal.  Reflex Scores:      Bicep reflexes are 2+ on the right side and 2+ on the left side.      Patellar reflexes are 2+ on the right side and 2+ on the left side. Skin: Skin is warm. No rash noted. No erythema.  Psychiatric: She has a normal mood and affect. Cognition and memory are normal.  Well groomed, good eye contact.   ASSESSMENT AND PLAN:  Dr. Erling Cruz was here today annual physical examination.  Orders Placed This Encounter  Procedures  . Basic metabolic panel  . Lipid panel    Lab Results  Component Value Date   CHOL 236 (H) 11/13/2019   HDL 66.70 11/13/2019   LDLCALC 156 (H) 11/13/2019  TRIG 64.0 11/13/2019   CHOLHDL 4 11/13/2019   Lab Results  Component Value Date   CREATININE 0.83 11/13/2019   BUN 9 11/13/2019   NA 136 11/13/2019   K 4.0 11/13/2019   CL 100 11/13/2019   CO2 31 11/13/2019   The 10-year ASCVD risk score Denman George DC Jr., et al., 2013) is: 0.9%   Values used to calculate the score:     Age: 77 years     Sex: Female     Is Non-Hispanic African American: Yes     Diabetic: No     Tobacco smoker: No     Systolic Blood Pressure: 120 mmHg     Is BP treated: Yes     HDL Cholesterol: 66.7 mg/dL     Total Cholesterol: 236 mg/dL   Routine general medical examination at a health care facility She understands the importance of regular physical activity and healthy diet for prevention of chronic illness and/or complications. Preventive guidelines reviewed. Vaccination up to date, including COVID 19 vaccination. Periodic eye exam recommended. Continue following with gynecologist for female preventive care. Next CPE in a year.  Screening for endocrine, metabolic and immunity disorder -     Basic metabolic panel  Hypertension, essential, benign BP adequately controlled. Continue amlodipine 10 mg daily and low-salt  diet. Continue monitoring BP regularly. F/U in a year.   Hyperlipidemia Continue nonpharmacologic treatment. Further recommendation will be given according to 10-year CVD score and lipid panel numbers. Follow-up in 1 year.   Return in 1 year (on 11/12/2020) for cpe.   G. [place], MD  [facility] [place] office.

## 2019-11-14 ENCOUNTER — Encounter: Payer: Self-pay | Admitting: Family Medicine

## 2020-01-13 MED FILL — AMLODIPINE BESYLATE 10 MG T: 10 | 90 days supply | Qty: 90 | Fill #0

## 2020-02-20 ENCOUNTER — Other Ambulatory Visit: Payer: Self-pay | Admitting: Family Medicine

## 2020-02-20 DIAGNOSIS — F418 Other specified anxiety disorders: Secondary | ICD-10-CM

## 2020-02-24 MED FILL — PROPRANOLOL 20 MG TABLET: 20 | 30 days supply | Qty: 30 | Fill #0

## 2020-02-26 ENCOUNTER — Encounter: Payer: Self-pay | Admitting: Family Medicine

## 2020-02-26 ENCOUNTER — Ambulatory Visit (INDEPENDENT_AMBULATORY_CARE_PROVIDER_SITE_OTHER): Payer: No Typology Code available for payment source | Admitting: Family Medicine

## 2020-02-26 ENCOUNTER — Other Ambulatory Visit: Payer: Self-pay

## 2020-02-26 VITALS — BP 128/70 | HR 64 | Temp 97.9°F | Resp 12 | Ht 70.0 in | Wt 143.0 lb

## 2020-02-26 DIAGNOSIS — G4700 Insomnia, unspecified: Secondary | ICD-10-CM

## 2020-02-26 MED ORDER — ZOLPIDEM TARTRATE 5 MG PO TABS: 2.5000 mg | ORAL_TABLET | Freq: Every evening | ORAL | 1 refills | Status: DC | PRN

## 2020-02-26 MED FILL — ZOLPIDEM TARTRATE 5 MG TAB: 5 | 15 days supply | Qty: 15 | Fill #0

## 2020-02-26 NOTE — Patient Instructions (Addendum)
A few things to remember from today's visit:  Insomnia, unspecified type  If you need refills please call your pharmacy. Do not use My Chart to request refills or for acute issues that need immediate attention.    Please be sure medication list is accurate. If a new problem present, please set up appointment sooner than planned today.

## 2020-02-26 NOTE — Progress Notes (Signed)
Chief Complaint  Patient presents with  . 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 female with hx of HTN and HLD here today with above complaint.  Problem has been going on intermittently for a while, usually is last for a few days but this time it has been daily x 13 days. She is afraid problem may affect job performance.  In the past she took [place] really helped. She goes to sleep around 11 pm and wakes up around 3 am, difficulty going back to sleep. Problem seems to be exacerbated by stress. OTC medications have not helped,including Melatonin. White noise helps some.  Negative for changes in appetite,abnormal wt loss,palpitations,tremor,or cold/heat intolerance.  Review of Systems  Constitutional: Positive for fatigue. Negative for activity change and fever.  Respiratory: Negative for shortness of breath.   Cardiovascular: Negative for chest pain and leg swelling.  Gastrointestinal: Negative for abdominal pain, nausea and vomiting.       Negative for changes in bowel habits.  Musculoskeletal: Negative for arthralgias and myalgias.  Neurological: Negative for syncope, weakness and headaches.  Rest see pertinent positives and negatives per HPI.  Current Outpatient Medications on File Prior to Visit  Medication Sig Dispense Refill  . amLODipine (NORVASC) 10 MG tablet Take 1 tablet (10 mg total) by mouth daily. 90 tablet 3  . propranolol (INDERAL) 20 MG tablet TAKE 1 TABLET BY MOUTH ONCE A DAY AS NEEDED 30 tablet 3   No current facility-administered medications on file prior to visit.   Past Medical History:  Diagnosis Date  . Hypertension   . Positive TB test    No Known Allergies  Social History   Socioeconomic History  . Marital status: Married    Spouse name: Not on file  . Number of children: 2  . Years of education: Not on file  . Highest education level: Not on file  Occupational History  . Not on  file  Tobacco Use  . Smoking status: Never Smoker  . Smokeless tobacco: Never Used  Vaping Use  . Vaping Use: Never used  Substance and Sexual Activity  . Alcohol use: Yes  . Drug use: No  . Sexual activity: Not on file  Other Topics Concern  . Not on file  Social History Narrative  . Not on file   Social Determinants of Health   Financial Resource Strain:   . Difficulty of Paying Living Expenses:   Food Insecurity:   . Worried About Programme researcher, broadcasting/film/video in the Last Year:   . Barista in the Last Year:   Transportation Needs:   . Freight forwarder (Medical):   Marland Kitchen Lack of Transportation (Non-Medical):   Physical Activity:   . Days of Exercise per Week:   . Minutes of Exercise per Session:   Stress:   . Feeling of Stress :   Social Connections:   . Frequency of Communication with Friends and Family:   . Frequency of Social Gatherings with Friends and Family:   . Attends Religious Services:   . Active Member of Clubs or Organizations:   . Attends Banker Meetings:   Marland Kitchen Marital Status:     Vitals:   02/26/20 0814  BP: 128/70  Pulse: 64  Resp: 12  Temp: 97.9 F (36.6 C)  SpO2: 98%   Body mass index is 20.52 kg/m.  Physical Exam Vitals and nursing note reviewed.  Constitutional:      General: She is not in acute distress.    Appearance: She is well-developed.  HENT:     Head: Normocephalic and atraumatic.  Eyes:     Conjunctiva/sclera: Conjunctivae normal.  Cardiovascular:     Rate and Rhythm: Normal rate and regular rhythm.     Heart sounds: No murmur heard.   Pulmonary:     Effort: Pulmonary effort is normal. No respiratory distress.     Breath sounds: Normal breath sounds.  Skin:    General: Skin is warm.     Findings: No erythema or rash.  Neurological:     Mental Status: She is alert and oriented to person, place, and time.     Cranial Nerves: No cranial nerve deficit.     Gait: Gait normal.  Psychiatric:     Comments:  Well groomed, good eye contact.    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  Diagnoses and all orders for this visit:  Insomnia, unspecified type -     zolpidem (AMBIEN) 5 MG tablet; Take 0.5-1 tablets (2.5-5 mg total) by mouth at bedtime as needed for sleep.   We discussed possible etiologies and pharmacologic treatment options. She tolerated Ambien well in the past and it helped. Ambien 2.5-5 mg at bedtime as needed, allow 8 hours of sleep after taking medication. We discussed some side effects. She will let me know if medication does not help or if any side effect. Good sleep hygiene. F/U as needed.  Madie Cahn G. [place], MD  [facility]. [place] office.   A few things to remember from today's visit:  Insomnia, unspecified type  If you need refills please call your pharmacy. Do not use My Chart to request refills or for acute issues that need immediate attention.    Please be sure medication list is accurate. If a new problem present, please set up appointment sooner than planned today.

## 2020-03-01 ENCOUNTER — Encounter: Payer: Self-pay | Admitting: Family Medicine

## 2020-04-28 ENCOUNTER — Other Ambulatory Visit: Payer: Self-pay

## 2020-04-29 ENCOUNTER — Encounter: Payer: Self-pay | Admitting: Family Medicine

## 2020-04-29 ENCOUNTER — Ambulatory Visit (INDEPENDENT_AMBULATORY_CARE_PROVIDER_SITE_OTHER): Payer: No Typology Code available for payment source | Admitting: Family Medicine

## 2020-04-29 VITALS — BP 120/70 | HR 86 | Resp 12 | Ht 70.0 in | Wt 145.0 lb

## 2020-04-29 DIAGNOSIS — L84 Corns and callosities: Secondary | ICD-10-CM

## 2020-04-29 NOTE — Progress Notes (Signed)
Chief Complaint  Patient presents with  . 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 Noted lesion 1-2 weeks ago. Tender lesion, intermittent. No hx of trauma. Pain is exacerbated by walking barefoot and with pressing area.  She has not tried OTC meds.  No associated edema or erythema on affected area. Negative for fever,chills,artrhalgias,or joint edema/erythema. Stable.  Review of Systems Rest see pertinent positives and negatives per HPI.  Current Outpatient Medications on File Prior to Visit  Medication Sig Dispense Refill  . amLODipine (NORVASC) 10 MG tablet Take 1 tablet (10 mg total) by mouth daily. 90 tablet 3  . propranolol (INDERAL) 20 MG tablet TAKE 1 TABLET BY MOUTH ONCE A DAY AS NEEDED 30 tablet 3  . zolpidem (AMBIEN) 5 MG tablet Take 0.5-1 tablets (2.5-5 mg total) by mouth at bedtime as needed for sleep. 15 tablet 1   No current facility-administered medications on file prior to visit.     Past Medical History:  Diagnosis Date  . Hypertension   . Positive TB test    No Known Allergies  Social History   Socioeconomic History  . Marital status: Married    Spouse name: Not on file  . Number of children: 2  . Years of education: Not on file  . Highest education level: Not on file  Occupational History  . Not on file  Tobacco Use  . Smoking status: Never Smoker  . Smokeless tobacco: Never Used  Vaping Use  . Vaping Use: Never used  Substance and Sexual Activity  . Alcohol use: Yes  . Drug use: No  . Sexual activity: Not on file  Other Topics Concern  . Not on file  Social History Narrative  . Not on file   Social Determinants of Health   Financial Resource Strain:   . Difficulty of Paying Living Expenses: Not on file  Food Insecurity:   . Worried About Programme researcher, broadcasting/film/video in the Last Year: Not on file  . Ran Out of Food in the Last Year: Not on  file  Transportation Needs:   . Lack of Transportation (Medical): Not on file  . Lack of Transportation (Non-Medical): Not on file  Physical Activity:   . Days of Exercise per Week: Not on file  . Minutes of Exercise per Session: Not on file  Stress:   . Feeling of Stress : Not on file  Social Connections:   . Frequency of Communication with Friends and Family: Not on file  . Frequency of Social Gatherings with Friends and Family: Not on file  . Attends Religious Services: Not on file  . Active Member of Clubs or Organizations: Not on file  . Attends Banker Meetings: Not on file  . Marital Status: Not on file    Vitals:   04/29/20 0854  BP: 120/70  Pulse: 86  Resp: 12  SpO2: 97%   Body mass index is 20.81 kg/m.  Physical Exam Vitals and nursing note reviewed.  Constitutional:      Appearance: She is normal weight.  HENT:     Head: Normocephalic and atraumatic.  Eyes:     Conjunctiva/sclera: Conjunctivae normal.  Musculoskeletal:     Right foot: Normal range of motion and normal capillary refill. Normal pulse.       Feet:     Comments: About 1 cm not raised,firm  thick lesion on right heel. Smooth surface. Mildly tender, no erythema or local heat. Small bunions.  Neurological:     General: No focal deficit present.     Mental Status: She is alert and oriented to person, place, and time.     Gait: Gait normal.   ASSESSMENT AND PLAN:  Dr.Shelbee was seen today for foot lesion.  Diagnoses and all orders for this visit:  Plantar callus  OTC calluses cushions devices and topical treatment may help. Comfortable shoes. Podiatrist evaluation may be necessary to discuss other treatment options.  Return if symptoms worsen or fail to improve.   Sarayu Prevost G. [place], MD  [facility]. [place] office.

## 2020-05-07 MED FILL — AMLODIPINE BESYLATE 10 MG T: 10 | 90 days supply | Qty: 90 | Fill #1

## 2020-07-06 ENCOUNTER — Ambulatory Visit: Payer: No Typology Code available for payment source | Attending: Internal Medicine

## 2020-07-06 DIAGNOSIS — Z23 Encounter for immunization: Secondary | ICD-10-CM

## 2020-07-06 NOTE — Progress Notes (Signed)
   Covid-19 Vaccination Clinic  Name:  Desiree Campbell    MRN: [id number] DOB: 24-May-1981  07/06/2020  Ms. Desiree Campbell was observed post Covid-19 immunization for 15 minutes without incident. She was provided with Vaccine Information Sheet and instruction to access the V-Safe system.   Ms. Desiree Campbell was instructed to call 911 with any severe reactions post vaccine: Marland Kitchen Difficulty breathing  . Swelling of face and throat  . A fast heartbeat  . A bad rash all over body  . Dizziness and weakness

## 2020-07-10 ENCOUNTER — Other Ambulatory Visit: Payer: Self-pay | Admitting: Obstetrics

## 2020-07-10 DIAGNOSIS — R928 Other abnormal and inconclusive findings on diagnostic imaging of breast: Secondary | ICD-10-CM

## 2020-07-28 ENCOUNTER — Ambulatory Visit: Payer: No Typology Code available for payment source

## 2020-07-28 ENCOUNTER — Ambulatory Visit
Admission: RE | Admit: 2020-07-28 | Discharge: 2020-07-28 | Disposition: A | Discharge status: Home / Self Care | Payer: No Typology Code available for payment source | Source: Ambulatory Visit | Attending: Obstetrics | Admitting: Obstetrics

## 2020-07-28 ENCOUNTER — Other Ambulatory Visit: Payer: Self-pay

## 2020-07-28 DIAGNOSIS — R928 Other abnormal and inconclusive findings on diagnostic imaging of breast: Secondary | ICD-10-CM

## 2020-08-18 MED FILL — AMLODIPINE BESYLATE 10 MG T: 10 | 90 days supply | Qty: 90 | Fill #2

## 2020-08-29 DIAGNOSIS — Z8679 Personal history of other diseases of the circulatory system: Secondary | ICD-10-CM

## 2020-08-29 HISTORY — DX: Personal history of other diseases of the circulatory system: Z86.79

## 2020-12-30 ENCOUNTER — Other Ambulatory Visit (HOSPITAL_COMMUNITY): Payer: Self-pay

## 2020-12-30 ENCOUNTER — Other Ambulatory Visit: Payer: Self-pay

## 2020-12-30 ENCOUNTER — Encounter: Payer: Self-pay | Admitting: Family Medicine

## 2020-12-30 ENCOUNTER — Ambulatory Visit (INDEPENDENT_AMBULATORY_CARE_PROVIDER_SITE_OTHER): Payer: No Typology Code available for payment source | Admitting: Family Medicine

## 2020-12-30 VITALS — BP 122/80 | HR 68 | Temp 98.7°F | Resp 12 | Ht 70.0 in | Wt 141.2 lb

## 2020-12-30 DIAGNOSIS — I10 Essential (primary) hypertension: Secondary | ICD-10-CM

## 2020-12-30 DIAGNOSIS — G4700 Insomnia, unspecified: Secondary | ICD-10-CM

## 2020-12-30 LAB — BASIC METABOLIC PANEL
BUN: 9 mg/dL (ref 6–23)
CO2: 29 mEq/L (ref 19–32)
Calcium: 10.1 mg/dL (ref 8.4–10.5)
Chloride: 100 mEq/L (ref 96–112)
Creatinine, Ser: 0.78 mg/dL (ref 0.40–1.20)
GFR: 93.81 mL/min (ref >60.00)
Glucose, Bld: 86 mg/dL (ref 70–99)
Potassium: 4.1 mEq/L (ref 3.5–5.1)
Sodium: 136 mEq/L (ref 135–145)

## 2020-12-30 MED ORDER — AMLODIPINE BESYLATE 10 MG PO TABS
10.0000 mg | ORAL_TABLET | Freq: Every day | ORAL | 3 refills | Status: DC
  Filled 2020-12-30: qty 90, 90d supply, fill #0
  Filled 2021-05-04: qty 90, 90d supply, fill #1

## 2020-12-30 NOTE — Progress Notes (Signed)
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 She was last seen on 04/29/20 for acute visit.  Currently on amlodipine 10 mg daily. She is taking medications as instructed, no side effects reported.  She has not noted unusual headache, visual changes, exertional chest pain, dyspnea,  focal weakness, or edema. Home BP readings: 120s/70s to 80. Lab Results  Component Value Date   CREATININE 0.83 11/13/2019   BUN 9 11/13/2019   NA 136 11/13/2019   K 4.0 11/13/2019   CL 100 11/13/2019   CO2 31 11/13/2019   Insomnia: She is on Ambien 2.5 to 5 mg as needed, usually when she wakes up around 2 AM.  She has not taken it in a while, usually wakes up around 4 AM now. She has tolerated medication well, no side effects.  Review of Systems  Constitutional: Negative for activity change, appetite change and fever.  HENT: Negative for mouth sores, nosebleeds and sore throat.   Respiratory: Negative for cough and wheezing.   Gastrointestinal: Negative for abdominal pain, nausea and vomiting.       Negative for changes in bowel habits.  Genitourinary: Negative for decreased urine volume and hematuria.  Neurological: Negative for syncope, facial asymmetry and weakness.  Psychiatric/Behavioral: Positive for sleep disturbance. Negative for confusion.  Rest see pertinent positives and negatives per HPI.  Current Outpatient Medications on File Prior to Visit  Medication Sig Dispense Refill  . propranolol (INDERAL) 20 MG tablet TAKE 1 TABLET BY MOUTH ONCE A DAY AS NEEDED 30 tablet 3  . zolpidem (AMBIEN) 5 MG tablet Take 0.5-1 tablets (2.5-5 mg total) by mouth at bedtime as needed for sleep. 15 tablet 1   No current facility-administered medications on file prior to visit.   Past Medical History:  Diagnosis Date  . Hypertension   . Positive TB test    No Known Allergies  Social History   Socioeconomic History  .  Marital status: Married    Spouse name: Not on file  . Number of children: 2  . Years of education: Not on file  . Highest education level: Not on file  Occupational History  . Not on file  Tobacco Use  . Smoking status: Never Smoker  . Smokeless tobacco: Never Used  Vaping Use  . Vaping Use: Never used  Substance and Sexual Activity  . Alcohol use: Yes  . Drug use: No  . Sexual activity: Not on file  Other Topics Concern  . Not on file  Social History Narrative  . Not on file   Social Determinants of Health   Financial Resource Strain: Not on file  Food Insecurity: Not on file  Transportation Needs: Not on file  Physical Activity: Not on file  Stress: Not on file  Social Connections: Not on file   Vitals:   12/30/20 1154  BP: 122/80  Pulse: 68  Resp: 12  Temp: 98.7 F (37.1 C)  SpO2: 98%   Body mass index is 20.26 kg/m.  Physical Exam Vitals and nursing note reviewed.  Constitutional:      General: She is not in acute distress.    Appearance: She is well-developed and normal weight.  HENT:     Head: Normocephalic and atraumatic.     Mouth/Throat:     Mouth: Mucous membranes are moist.     Pharynx: Oropharynx is clear.  Eyes:     Conjunctiva/sclera: Conjunctivae normal.  Cardiovascular:     Rate and Rhythm: Normal rate and regular rhythm.     Pulses:          Dorsalis pedis pulses are 2+ on the right side and 2+ on the left side.     Heart sounds: No murmur heard.   Pulmonary:     Effort: Pulmonary effort is normal. No respiratory distress.     Breath sounds: Normal breath sounds.  Abdominal:     Palpations: Abdomen is soft. There is no hepatomegaly or mass.     Tenderness: There is no abdominal tenderness.  Lymphadenopathy:     Cervical: No cervical adenopathy.  Skin:    General: Skin is warm.     Findings: No erythema or rash.  Neurological:     Mental Status: She is alert and oriented to person, place, and time.     Cranial Nerves: No  cranial nerve deficit.     Gait: Gait normal.  Psychiatric:     Comments: Well groomed, good eye contact.   ASSESSMENT AND PLAN:   Dr. 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  Diagnoses and all orders for this visit: Orders Placed This Encounter  Procedures  . Basic metabolic panel   Lab Results  Component Value Date   CREATININE 0.78 12/30/2020   BUN 9 12/30/2020   NA 136 12/30/2020   K 4.1 12/30/2020   CL 100 12/30/2020   CO2 29 12/30/2020   Insomnia, unspecified type Problem is stable. Continue Ambien 2.5 to 5 mg nightly as needed. She does not need refills at this time, pharmacy will call me when she needs refills. Good sleep hygiene also important.  Hypertension, essential, benign BP adequately controlled. Continue amlodipine 10 mg daily and low salt diet. Continue monitoring BP regularly. Annual eye exam.  -     amLODipine (NORVASC) 10 MG tablet; Take 1 tablet (10 mg total) by mouth daily.  Return in about 1 year (around 12/30/2021) for CPE and f/u.   Emmitt Matthews G. [place], MD  [facility]. [place] office.  A few things to remember from today's visit:  Insomnia, unspecified type  Hypertension, essential, benign - Plan: amLODipine (NORVASC) 10 MG tablet, Basic metabolic panel  If you need refills please call your pharmacy. Do not use My Chart to request refills or for acute issues that need immediate attention.  No changes today.  Please be sure medication list is accurate. If a new problem present, please set up appointment sooner than planned today.

## 2020-12-30 NOTE — Patient Instructions (Addendum)
A few things to remember from today's visit:  Insomnia, unspecified type  Hypertension, essential, benign - Plan: amLODipine (NORVASC) 10 MG tablet, Basic metabolic panel  If you need refills please call your pharmacy. Do not use My Chart to request refills or for acute issues that need immediate attention.  No changes today.  Please be sure medication list is accurate. If a new problem present, please set up appointment sooner than planned today.

## 2021-03-10 ENCOUNTER — Ambulatory Visit: Payer: No Typology Code available for payment source | Admitting: Family Medicine

## 2021-03-20 ENCOUNTER — Encounter: Payer: Self-pay | Admitting: Family Medicine

## 2021-03-20 DIAGNOSIS — G4700 Insomnia, unspecified: Secondary | ICD-10-CM

## 2021-03-20 DIAGNOSIS — F418 Other specified anxiety disorders: Secondary | ICD-10-CM

## 2021-03-22 ENCOUNTER — Other Ambulatory Visit (HOSPITAL_COMMUNITY): Payer: Self-pay

## 2021-03-22 MED ORDER — PROPRANOLOL HCL 20 MG PO TABS
20.0000 mg | ORAL_TABLET | Freq: Every day | ORAL | 3 refills | Status: DC | PRN
  Filled 2021-03-22: qty 90, 90d supply, fill #0
  Filled 2021-12-23: qty 90, 90d supply, fill #1

## 2021-03-23 ENCOUNTER — Other Ambulatory Visit (HOSPITAL_COMMUNITY): Payer: Self-pay

## 2021-03-23 MED ORDER — ZOLPIDEM TARTRATE 5 MG PO TABS
2.5000 mg | ORAL_TABLET | Freq: Every evening | ORAL | 1 refills | Status: DC | PRN
  Filled 2021-03-23: qty 15, 15d supply, fill #0

## 2021-05-04 ENCOUNTER — Other Ambulatory Visit (HOSPITAL_COMMUNITY): Payer: Self-pay

## 2021-06-28 ENCOUNTER — Ambulatory Visit: Payer: No Typology Code available for payment source | Admitting: Family Medicine

## 2021-07-27 NOTE — Progress Notes (Signed)
Chief Complaint  Patient presents with   Palpitations    Infrequent, patient believes they are stress related. Palpitations decreased when switched to part time but will still feel "flutters" every once in a while. Slowing down and breathing can help it. Patient also feels chest pain separate from the palpitations.    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 She has had problem for a few months. Skipping beats.  Palpitations  This is a recurrent problem. The problem has been gradually improving. The symptoms are aggravated by stress. Associated symptoms include anxiety. Pertinent negatives include no coughing, diaphoresis, dizziness, fever, malaise/fatigue, nausea, near-syncope, numbness, shortness of breath, syncope, vomiting or weakness. She has tried beta blockers for the symptoms. The treatment provided mild relief. Risk factors include dyslipidemia and stress.   She was evaluated by cardiologist at age 28-14 because heart murmur, Holter monitor was done at that time and work up was negative.  Sometimes CP with stress and R.L arm discomfort but these symptoms do not present at the same time with palpitations. She drinks the same amount of caffeine, does not drink sodas,and does not like chocolate.  She takes Propranolol as needed for anxiety, took med more frequent for a few days and noted improvement of palpations.  Planning on mountain trip, [place] altitude, in 02/2022.  Insomnia has improved, she has not needed Ambien in months. Anxiety on Propranolol 20 mg daily prn. 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  HLD on non pharmacologic treatment. Lab Results  Component Value Date   CHOL 236 (H) 11/13/2019   HDL 66.70 11/13/2019   LDLCALC 156 (H) 11/13/2019   TRIG 64.0 11/13/2019   CHOLHDL 4 11/13/2019   The 10-year ASCVD risk  score (Arnett DK, et al., 2019) is: 1%   Values used to calculate the score:     Age: 40 years     Sex: Female     Is Non-Hispanic African American: Yes     Diabetic: No     Tobacco smoker: No     Systolic Blood Pressure: 123456 mmHg     Is BP treated: Yes     HDL Cholesterol: 66.7 mg/dL     Total Cholesterol: 236 mg/dL  HTN:She is on Amlodipine 10 mg daily. Home BP's are "good." Tolerating medication well.  Lab Results  Component Value Date   CREATININE 0.78 12/30/2020   BUN 9 12/30/2020   NA 136 12/30/2020   K 4.1 12/30/2020   CL 100 12/30/2020   CO2 29 12/30/2020   Review of Systems  Constitutional:  Negative for diaphoresis, fever and malaise/fatigue.  Eyes:  Negative for redness and visual disturbance.  Respiratory:  Negative for cough, shortness of breath and wheezing.   Cardiovascular:  Positive for palpitations. Negative for syncope and near-syncope.  Gastrointestinal:  Negative for nausea and vomiting.  Endocrine: Negative for cold intolerance and heat intolerance.  Genitourinary:  Negative for decreased urine volume and hematuria.  Musculoskeletal:  Negative for gait problem and myalgias.  Skin:  Negative for pallor and rash.  Neurological:  Negative for dizziness, weakness, numbness and headaches.  Hematological:  Negative for adenopathy. Does not bruise/bleed easily.  Psychiatric/Behavioral:  Negative for confusion. The patient is nervous/anxious.   Rest see pertinent positives and negatives per HPI.  Current Outpatient Medications on File Prior to Visit  Medication Sig Dispense Refill   amLODipine (NORVASC)  10 MG tablet Take 1 tablet (10 mg total) by mouth daily. 90 tablet 3   propranolol (INDERAL) 20 MG tablet Take 1 tablet (20 mg total) by mouth daily as needed. 90 tablet 3   zolpidem (AMBIEN) 5 MG tablet Take 0.5-1 tablets (2.5-5 mg total) by mouth at bedtime as needed for sleep. 15 tablet 1   No current facility-administered medications on file prior to  visit.   Past Medical History:  Diagnosis Date   Hypertension    Positive TB test    No Known Allergies  Social History   Socioeconomic History   Marital status: Married    Spouse name: Not on file   Number of children: 2   Years of education: Not on file   Highest education level: Professional school degree (e.g., MD, DDS, DVM, JD)  Occupational History   Not on file  Tobacco Use   Smoking status: Never   Smokeless tobacco: Never  Vaping Use   Vaping Use: Never used  Substance and Sexual Activity   Alcohol use: Yes   Drug use: No   Sexual activity: Not on file  Other Topics Concern   Not on file  Social History Narrative   Not on file   Social Determinants of Health   Financial Resource Strain: Low Risk    Difficulty of Paying Living Expenses: Not hard at all  Food Insecurity: No Food Insecurity   Worried About Charity fundraiser in the Last Year: Never true   [place] in the Last Year: Never true  Transportation Needs: No Transportation Needs   Lack of Transportation (Medical): No   Lack of Transportation (Non-Medical): No  Physical Activity: Sufficiently Active   Days of Exercise per Week: 3 days   Minutes of Exercise per Session: 50 min  Stress: No Stress Concern Present   Feeling of Stress : Not at all  Social Connections: Moderately Integrated   Frequency of Communication with Friends and Family: Three times a week   Frequency of Social Gatherings with Friends and Family: Twice a week   Attends Religious Services: Never   Marine scientist or Organizations: Yes   Attends Music therapist: More than 4 times per year   Marital Status: Married   Vitals:   07/28/21 1019  BP: 120/84  Pulse: 70  Resp: 12  Temp: 98.2 F (36.8 C)  SpO2: 97%   Body mass index is 20.58 kg/m.  Physical Exam Vitals and nursing note reviewed.  Constitutional:      General: She is not in acute distress.    Appearance: She is well-developed and  normal weight.  HENT:     Head: Normocephalic and atraumatic.     Mouth/Throat:     Mouth: Mucous membranes are moist.     Pharynx: Oropharynx is clear.  Eyes:     Conjunctiva/sclera: Conjunctivae normal.  Cardiovascular:     Rate and Rhythm: Normal rate and regular rhythm.     Pulses:          Dorsalis pedis pulses are 2+ on the right side and 2+ on the left side.     Heart sounds: No murmur heard. Pulmonary:     Effort: Pulmonary effort is normal. No respiratory distress.     Breath sounds: Normal breath sounds.  Abdominal:     Palpations: Abdomen is soft. There is no hepatomegaly or mass.     Tenderness: There is no abdominal tenderness.  Lymphadenopathy:  Cervical: No cervical adenopathy.  Skin:    General: Skin is warm.     Findings: No erythema or rash.  Neurological:     General: No focal deficit present.     Mental Status: She is alert and oriented to person, place, and time.     Cranial Nerves: No cranial nerve deficit.     Gait: Gait normal.  Psychiatric:     Comments: Well groomed, good eye contact.   ASSESSMENT AND PLAN:  Dr. 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  Diagnoses and all orders for this visit: Orders Placed This Encounter  Procedures   TSH   CBC   Basic metabolic panel   Ambulatory referral to Cardiology   EKG 12-Lead   Lab Results  Component Value Date   TSH 2.26 07/28/2021   Lab Results  Component Value Date   CREATININE 0.76 07/28/2021   BUN 6 07/28/2021   NA 136 07/28/2021   K 3.7 07/28/2021   CL 100 07/28/2021   CO2 29 07/28/2021   Lab Results  Component Value Date   WBC 3.0 (L) 07/28/2021   HGB 16.0 (H) 07/28/2021   HCT 47.4 (H) 07/28/2021   MCV 90.5 07/28/2021   PLT 271.0 07/28/2021   Palpitations We discussed possible etiologies. Hx and examination today do not suggest a serious process. EKG today: SR (?sinus arrhythmia),normal axis and intervals. No other EKG for comparison. She may benefit from wearing a  cardiac monitor for a few days. Cardiology referral placed. Instructed about warning signs.  Hypertension, essential, benign BP adequately controlled. Continue Amlodipine 10 mg daily and low salt diet.  Anxiety disorder, unspecified type She feels like problem has improved since she she decreased work hours. She does not think an SSRI is needed at this time. Continue Propranolol 20 mg daily prn.  Hyperlipidemia, unspecified hyperlipidemia type Continue non pharmacologic treatment. We can repeat FLP next visit.  Return if symptoms worsen or fail to improve.  Amilia Vandenbrink G. [place], MD  [facility]. [place] office.

## 2021-07-28 ENCOUNTER — Ambulatory Visit (INDEPENDENT_AMBULATORY_CARE_PROVIDER_SITE_OTHER): Payer: No Typology Code available for payment source | Admitting: Family Medicine

## 2021-07-28 VITALS — BP 120/84 | HR 70 | Temp 98.2°F | Resp 12 | Ht 70.0 in | Wt 143.4 lb

## 2021-07-28 DIAGNOSIS — F419 Anxiety disorder, unspecified: Secondary | ICD-10-CM | POA: Diagnosis not present

## 2021-07-28 DIAGNOSIS — R002 Palpitations: Secondary | ICD-10-CM | POA: Diagnosis not present

## 2021-07-28 DIAGNOSIS — I10 Essential (primary) hypertension: Secondary | ICD-10-CM | POA: Diagnosis not present

## 2021-07-28 DIAGNOSIS — E785 Hyperlipidemia, unspecified: Secondary | ICD-10-CM

## 2021-07-28 DIAGNOSIS — R7989 Other specified abnormal findings of blood chemistry: Secondary | ICD-10-CM

## 2021-07-28 LAB — BASIC METABOLIC PANEL
BUN: 6 mg/dL (ref 6–23)
CO2: 29 mEq/L (ref 19–32)
Calcium: 10.2 mg/dL (ref 8.4–10.5)
Chloride: 100 mEq/L (ref 96–112)
Creatinine, Ser: 0.76 mg/dL (ref 0.40–1.20)
GFR: 96.39 mL/min (ref >60.00)
Glucose, Bld: 78 mg/dL (ref 70–99)
Potassium: 3.7 mEq/L (ref 3.5–5.1)
Sodium: 136 mEq/L (ref 135–145)

## 2021-07-28 LAB — TSH: TSH: 2.26 u[IU]/mL (ref 0.35–5.50)

## 2021-07-28 LAB — CBC
HCT: 47.4 % — ABNORMAL HIGH (ref 36.0–46.0)
Hemoglobin: 16 g/dL — ABNORMAL HIGH (ref 12.0–15.0)
MCHC: 33.7 g/dL (ref 30.0–36.0)
MCV: 90.5 fl (ref 78.0–100.0)
Platelets: 271 10*3/uL (ref 150.0–400.0)
RBC: 5.23 Mil/uL — ABNORMAL HIGH (ref 3.87–5.11)
RDW: 13.2 % (ref 11.5–15.5)
WBC: 3 10*3/uL — ABNORMAL LOW (ref 4.0–10.5)

## 2021-07-28 NOTE — Patient Instructions (Addendum)
A few things to remember from today's visit:  Palpitations - Plan: EKG 12-Lead, Ambulatory referral to Cardiology, CBC, Basic metabolic panel, TSH  Hypertension, essential, benign  Anxiety disorder, unspecified type  If you need refills please call your pharmacy. Do not use My Chart to request refills or for acute issues that need immediate attention.   Please be sure medication list is accurate. If a new problem present, please set up appointment sooner than planned today.

## 2021-07-31 MED ORDER — AMLODIPINE BESYLATE 10 MG PO TABS
10.0000 mg | ORAL_TABLET | Freq: Every day | ORAL | 3 refills | Status: DC
  Filled 2021-07-31 – 2021-08-13 (×2): qty 90, 90d supply, fill #0
  Filled 2021-12-10: qty 90, 90d supply, fill #1
  Filled 2022-04-04: qty 90, 90d supply, fill #2
  Filled 2022-07-25: qty 90, 90d supply, fill #3

## 2021-08-02 ENCOUNTER — Other Ambulatory Visit (HOSPITAL_COMMUNITY): Payer: Self-pay

## 2021-08-04 ENCOUNTER — Ambulatory Visit: Payer: No Typology Code available for payment source | Admitting: Family Medicine

## 2021-08-04 NOTE — Progress Notes (Signed)
Cardiology Office Note:   Date:  08/05/2021  NAME:  Desiree Campbell    MRN: [id number] DOB:  Jan 26, 1981   PCP:  [place], Betty G, MD  Cardiologist:  None  Electrophysiologist:  None   Referring MD: [place], Betty G, MD   Chief Complaint  Patient presents with   Palpitations        History of Present Illness:   Desiree Campbell is a 40 y.o. female with a hx of HTN, anxiety who is being seen today for the evaluation of palpitations at the request of [place], Timoteo Expose, MD. TSH 2.2.  She presents for palpitations.  She has had palpitations for the last 6 months.  She reports it started in July and October.  She reports it would occur with stressful situations.  She describes a skipped heartbeat sensation.  She reports symptoms would last seconds and resolve without intervention.  No identifiable triggers other than stress.  She does suffer from situational anxiety.  She takes propranolol.  She can also have chest pain episodes with stress.  She has no limitations with activity.  She reports she does not exercise routinely but does activity such as hiking.  No chest pain or trouble breathing.  Her EKG in office is normal.  Her medical history is significant for hypertension.  She takes amlodipine 10 mg daily.  Her blood pressure is well controlled.  She has never had a heart attack or stroke.  Most recent thyroid studies are normal.  She is not anemic.  Family history is significant for hypertension in her father.  Heart disease does not appear to be a major issue for her.  Most recent cholesterol shows total 236, HDL 67, LDL 156, triglycerides 64.  She appears to be quite healthy otherwise.  Her symptoms have not occurred for the past 2 to 3 weeks.  Apparently she was working more heavily during the fall months.  She reports she has decreased her workload and symptoms have improved.  She almost canceled her appointment.  Symptoms appear to occur exclusively with stressful  situations.  Past Medical History: Past Medical History:  Diagnosis Date   Hypertension    Positive TB test     Past Surgical History: History reviewed. No pertinent surgical history.  Current Medications: Current Meds  Medication Sig   amLODipine (NORVASC) 10 MG tablet Take 1 tablet (10 mg total) by mouth daily.   propranolol (INDERAL) 20 MG tablet Take 1 tablet (20 mg total) by mouth daily as needed.   zolpidem (AMBIEN) 5 MG tablet Take 0.5-1 tablets (2.5-5 mg total) by mouth at bedtime as needed for sleep.     Allergies:    Patient has no known allergies.   Social History: Social History   Socioeconomic History   Marital status: Married    Spouse name: Not on file   Number of children: 2   Years of education: Not on file   Highest education level: Professional school degree (e.Campbell., MD, DDS, DVM, JD)  Occupational History   Occupation: Pediatrician  Tobacco Use   Smoking status: Never   Smokeless tobacco: Never  Vaping Use   Vaping Use: Never used  Substance and Sexual Activity   Alcohol use: Yes   Drug use: No   Sexual activity: Not on file  Other Topics Concern   Not on file  Social History Narrative   Not on file   Social Determinants of Health   Financial Resource Strain: Low Risk  Difficulty of Paying Living Expenses: Not hard at all  Food Insecurity: No Food Insecurity   Worried About Running Out of Food in the Last Year: Never true   Ran Out of Food in the Last Year: Never true  Transportation Needs: No Transportation Needs   Lack of Transportation (Medical): No   Lack of Transportation (Non-Medical): No  Physical Activity: Sufficiently Active   Days of Exercise per Week: 3 days   Minutes of Exercise per Session: 50 min  Stress: No Stress Concern Present   Feeling of Stress : Not at all  Social Connections: Moderately Integrated   Frequency of Communication with Friends and Family: Three times a week   Frequency of Social Gatherings with  Friends and Family: Twice a week   Attends Religious Services: Never   Database administrator or Organizations: Yes   Attends Engineer, structural: More than 4 times per year   Marital Status: Married     Family History: The patient's family history includes Alcohol abuse in her maternal grandmother; Arthritis in her father and paternal grandmother; Breast cancer in her paternal aunt and paternal grandmother; Cancer in her paternal grandmother; Diabetes in her mother; Luiz Blare' disease in her mother; Hyperlipidemia in her father, mother, and sister; Miscarriages / [place] in her mother.  ROS:   All other ROS reviewed and negative. Pertinent positives noted in the HPI.     EKGs/Labs/Other Studies Reviewed:   The following studies were personally reviewed by me today:  EKG:  EKG is ordered today.  The ekg ordered today demonstrates NSR 72 bpm, no acute ischemic changes, and was personally reviewed by me.   Recent Labs: 07/28/2021: BUN 6; Creatinine, Ser 0.76; Hemoglobin 16.0; Platelets 271.0; Potassium 3.7; Sodium 136; TSH 2.26   Recent Lipid Panel    Component Value Date/Time   CHOL 236 (H) 11/13/2019 1003   TRIG 64.0 11/13/2019 1003   HDL 66.70 11/13/2019 1003   CHOLHDL 4 11/13/2019 1003   VLDL 12.8 11/13/2019 1003   LDLCALC 156 (H) 11/13/2019 1003    Physical Exam:   VS:  BP 110/81   Pulse 72   Ht 5' 8.5" (1.74 m)   Wt 146 lb 12.8 oz (66.6 kg)   SpO2 97%   BMI 22.00 kg/m    Wt Readings from Last 3 Encounters:  08/05/21 146 lb 12.8 oz (66.6 kg)  07/28/21 143 lb 6.4 oz (65 kg)  12/30/20 141 lb 3.2 oz (64 kg)    General: Well nourished, well developed, in no acute distress Head: Atraumatic, normal size  Eyes: PEERLA, EOMI  Neck: Supple, no JVD Endocrine: No thryomegaly Cardiac: Normal S1, S2; RRR; no murmurs, rubs, or gallops Lungs: Clear to auscultation bilaterally, no wheezing, rhonchi or rales  Abd: Soft, nontender, no hepatomegaly  Ext: No edema,  pulses 2+ Musculoskeletal: No deformities, BUE and BLE strength normal and equal Skin: Warm and dry, no rashes   Neuro: Alert and oriented to person, place, time, and situation, CNII-XII grossly intact, no focal deficits  Psych: Normal mood and affect   ASSESSMENT:   Desiree Campbell is a 40 y.o. female who presents for the following: 1. Palpitations     PLAN:   1. Palpitations -She reports episodes of palpitations that occur with stressful situations.  Symptoms have decreased that she has taken a decreased workload.  She currently works as a Optometrist in town.  She also describes sharp chest pain with stressful situations.  She has suffered  from situational anxiety for years and takes propranolol.  She is having no further episodes as her workload has decreased.  I strongly believe this is just stress related.  She is having no further symptoms and I believe a monitor would not be beneficial.  Her EKG is normal in office.  Her exam is normal.  I do not have a strong suspicion for arrhythmia.  Her most recent TSH was normal as well.  No evidence of anemia.  We discussed pursuing the cardia mobile device or other ambulatory ECG technology.  I believe this would be worthwhile given that her symptoms have resolved.  She has suffered with situational anxiety for some time and so if symptoms do come back she would be able to monitor her heart rhythm with this device.  She can then see [place] back as needed.  I have recommended regular exercise and stress reduction strategies.  She will work on this.  Disposition: Return if symptoms worsen or fail to improve.  Medication Adjustments/Labs and Tests Ordered: Current medicines are reviewed at length with the patient today.  Concerns regarding medicines are outlined above.  Orders Placed This Encounter  Procedures   EKG 12-Lead    No orders of the defined types were placed in this encounter.   Patient Instructions  Medication  Instructions:  The current medical regimen is effective;  continue present plan and medications.  *If you need a refill on your cardiac medications before your next appointment, please call your pharmacy*   Follow-Up: At [facility], you and your health needs are our priority.  As part of our continuing mission to provide you with exceptional heart care, we have created designated Provider Care Teams.  These Care Teams include your primary Cardiologist (physician) and Advanced Practice Providers (APPs -  Physician Assistants and Nurse Practitioners) who all work together to provide you with the care you need, when you need it.  We recommend signing up for the patient portal called "MyChart".  Sign up information is provided on this After Visit Summary.  MyChart is used to connect with patients for Virtual Visits (Telemedicine).  Patients are able to view lab/test results, encounter notes, upcoming appointments, etc.  Non-urgent messages can be sent to your provider as well.   To learn more about what you can do with MyChart, go to [email or web address].    Your next appointment:   As needed  The format for your next appointment:   In Person  Provider:   Lennie Odor, MD     Signed, Lenna Gilford. Flora Lipps, MD, [facility]  [facility]  [address] [place], [place] [address] [phone]  08/05/2021 12:35 PM

## 2021-08-05 ENCOUNTER — Ambulatory Visit (INDEPENDENT_AMBULATORY_CARE_PROVIDER_SITE_OTHER): Payer: No Typology Code available for payment source | Admitting: Cardiovascular Disease

## 2021-08-05 ENCOUNTER — Other Ambulatory Visit: Payer: Self-pay

## 2021-08-05 ENCOUNTER — Encounter: Payer: Self-pay | Admitting: Cardiovascular Disease

## 2021-08-05 VITALS — BP 110/81 | HR 72 | Ht 68.5 in | Wt 146.8 lb

## 2021-08-05 DIAGNOSIS — R002 Palpitations: Secondary | ICD-10-CM

## 2021-08-05 NOTE — Patient Instructions (Signed)
Medication Instructions:  The current medical regimen is effective;  continue present plan and medications.  *If you need a refill on your cardiac medications before your next appointment, please call your pharmacy*    Follow-Up: At [facility], you and your health needs are our priority.  As part of our continuing mission to provide you with exceptional heart care, we have created designated Provider Care Teams.  These Care Teams include your primary Cardiologist (physician) and Advanced Practice Providers (APPs -  Physician Assistants and Nurse Practitioners) who all work together to provide you with the care you need, when you need it.  We recommend signing up for the patient portal called "MyChart".  Sign up information is provided on this After Visit Summary.  MyChart is used to connect with patients for Virtual Visits (Telemedicine).  Patients are able to view lab/test results, encounter notes, upcoming appointments, etc.  Non-urgent messages can be sent to your provider as well.   To learn more about what you can do with MyChart, go to [email or web address].    Your next appointment:   As needed  The format for your next appointment:   In Person  Provider:   [place] O'Neal, MD      

## 2021-08-10 ENCOUNTER — Other Ambulatory Visit (HOSPITAL_COMMUNITY): Payer: Self-pay

## 2021-08-13 ENCOUNTER — Other Ambulatory Visit (HOSPITAL_COMMUNITY): Payer: Self-pay

## 2021-10-21 ENCOUNTER — Other Ambulatory Visit: Payer: Self-pay | Admitting: Obstetrics

## 2021-10-21 DIAGNOSIS — Z1231 Encounter for screening mammogram for malignant neoplasm of breast: Secondary | ICD-10-CM

## 2021-10-25 ENCOUNTER — Ambulatory Visit
Admission: RE | Admit: 2021-10-25 | Discharge: 2021-10-25 | Disposition: A | Discharge status: Home / Self Care | Payer: No Typology Code available for payment source | Source: Ambulatory Visit | Attending: Obstetrics | Admitting: Obstetrics

## 2021-10-25 DIAGNOSIS — Z1231 Encounter for screening mammogram for malignant neoplasm of breast: Secondary | ICD-10-CM

## 2021-10-26 ENCOUNTER — Other Ambulatory Visit: Payer: Self-pay | Admitting: Obstetrics

## 2021-10-26 DIAGNOSIS — R928 Other abnormal and inconclusive findings on diagnostic imaging of breast: Secondary | ICD-10-CM

## 2021-11-09 ENCOUNTER — Other Ambulatory Visit: Payer: No Typology Code available for payment source

## 2021-11-10 ENCOUNTER — Ambulatory Visit
Admission: RE | Admit: 2021-11-10 | Discharge: 2021-11-10 | Disposition: A | Discharge status: Home / Self Care | Payer: No Typology Code available for payment source | Source: Ambulatory Visit | Attending: Obstetrics | Admitting: Obstetrics

## 2021-11-10 DIAGNOSIS — R928 Other abnormal and inconclusive findings on diagnostic imaging of breast: Secondary | ICD-10-CM

## 2021-12-10 ENCOUNTER — Other Ambulatory Visit (HOSPITAL_COMMUNITY): Payer: Self-pay

## 2021-12-13 ENCOUNTER — Other Ambulatory Visit (HOSPITAL_COMMUNITY): Payer: Self-pay

## 2021-12-23 ENCOUNTER — Other Ambulatory Visit (HOSPITAL_COMMUNITY): Payer: Self-pay

## 2021-12-23 MED ORDER — FLUCONAZOLE 150 MG PO TABS
150.0000 mg | ORAL_TABLET | ORAL | 0 refills | Status: DC
Start: 2021-12-23 — End: 2022-01-06
  Filled 2021-12-23: qty 3, 7d supply, fill #0

## 2021-12-23 MED ORDER — NYSTATIN-TRIAMCINOLONE 100000-0.1 UNIT/GM-% EX OINT
1.0000 "application " | TOPICAL_OINTMENT | Freq: Two times a day (BID) | CUTANEOUS | 0 refills | Status: DC
  Filled 2021-12-23: qty 30, 15d supply, fill #0

## 2021-12-23 MED ORDER — NYSTATIN-TRIAMCINOLONE 100000-0.1 UNIT/GM-% EX OINT
1.0000 "application " | TOPICAL_OINTMENT | Freq: Two times a day (BID) | CUTANEOUS | 0 refills | Status: DC | PRN
  Filled 2021-12-23 (×2): qty 30, 10d supply, fill #0

## 2022-03-01 ENCOUNTER — Other Ambulatory Visit: Payer: Self-pay | Admitting: Family Medicine

## 2022-03-01 DIAGNOSIS — G4700 Insomnia, unspecified: Secondary | ICD-10-CM

## 2022-03-02 ENCOUNTER — Other Ambulatory Visit (HOSPITAL_COMMUNITY): Payer: Self-pay

## 2022-03-02 NOTE — Telephone Encounter (Signed)
-    last filled 03/23/21

## 2022-03-04 ENCOUNTER — Other Ambulatory Visit (HOSPITAL_COMMUNITY): Payer: Self-pay

## 2022-03-04 ENCOUNTER — Other Ambulatory Visit: Payer: Self-pay | Admitting: Family Medicine

## 2022-03-04 DIAGNOSIS — G4700 Insomnia, unspecified: Secondary | ICD-10-CM

## 2022-03-04 MED ORDER — ZOLPIDEM TARTRATE 5 MG PO TABS
2.5000 mg | ORAL_TABLET | Freq: Every evening | ORAL | 1 refills | Status: DC | PRN
  Filled 2022-03-04: qty 15, 15d supply, fill #0

## 2022-04-04 ENCOUNTER — Other Ambulatory Visit (HOSPITAL_COMMUNITY): Payer: Self-pay

## 2022-05-31 NOTE — Progress Notes (Signed)
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 Since her last visit she has seen cardiologist for palpitations.  She has been following with gynecologist for fibroids and ovarian dermoid cyst, as part of work up a genetic test was recommended, which was positive for pathogenic mutation, [id number], consistent with Lynch syndrome. FHx negative for ovarian or endometrial cancer.  Breast cancer (paternal aunt and grandmother) and colon cancer (PGM in her 72's). She has not noted abdominal pain, nausea, melena, or changes in bowel habits. Propranolol 20 mg daily as needed for situational anxiety and palpitations. Hypertension: On  Amlodipine 10 mg daily. Negative for unusual or severe headache, visual changes, exertional chest pain, dyspnea,  focal weakness, or edema.  Lab Results  Component Value Date   CREATININE 0.76 07/28/2021   BUN 6 07/28/2021   NA 136 07/28/2021   K 3.7 07/28/2021   CL 100 07/28/2021   CO2 29 07/28/2021   Hyperlipidemia: Currently on nonpharmacologic treatment. Lab Results  Component Value Date   CHOL 236 (H) 11/13/2019   HDL 66.70 11/13/2019   LDLCALC 156 (H) 11/13/2019   TRIG 64.0 11/13/2019   CHOLHDL 4 11/13/2019   Insomnia: She takes Ambien 5 mg 1/2 to 1 tablet at bedtime as needed, she takes medication infrequently.  Review of Systems  Constitutional:  Negative for activity change, appetite change and fever.  HENT:  Negative for mouth sores, nosebleeds, sore throat and trouble swallowing.   Respiratory:  Negative for cough and wheezing.   Cardiovascular:  Negative for leg swelling.  Gastrointestinal:  Negative for blood in stool and vomiting.  Endocrine: Negative for cold intolerance and heat intolerance.  Genitourinary:  Negative for decreased urine volume, dysuria and hematuria.  Neurological:  Negative for syncope and facial asymmetry.  Rest see pertinent positives and negatives per  HPI.  Current Outpatient Medications on File Prior to Visit  Medication Sig Dispense Refill   amLODipine (NORVASC) 10 MG tablet Take 1 tablet (10 mg total) by mouth daily. 90 tablet 3   propranolol (INDERAL) 20 MG tablet Take 1 tablet (20 mg total) by mouth daily as needed. 90 tablet 3   zolpidem (AMBIEN) 5 MG tablet Take 0.5-1 tablets (2.5-5 mg total) by mouth at bedtime as needed for sleep. 15 tablet 1   No current facility-administered medications on file prior to visit.   Past Medical History:  Diagnosis Date   Anxiety    Hypertension    Positive TB test    No Known Allergies  Social History   Socioeconomic History   Marital status: Married    Spouse name: Not on file   Number of children: 2   Years of education: Not on file   Highest education level: Professional school degree (e.g., MD, DDS, DVM, JD)  Occupational History   Occupation: Pediatrician  Tobacco Use   Smoking status: Never   Smokeless tobacco: Never  Vaping Use   Vaping Use: Never used  Substance and Sexual Activity   Alcohol use: Yes   Drug use: No   Sexual activity: Not on file  Other Topics Concern   Not on file  Social History Narrative   Not on file   Social Determinants of Health   Financial Resource Strain: Low Risk  (07/28/2021)   Overall Financial Resource Strain (CARDIA)    Difficulty of Paying Living Expenses: Not hard at all  Food Insecurity: No Food Insecurity (07/28/2021)   Hunger  Vital Sign    Worried About Programme researcher, broadcasting/film/video in the Last Year: Never true    Ran Out of Food in the Last Year: Never true  Transportation Needs: No Transportation Needs (07/28/2021)   PRAPARE - Administrator, Civil Service (Medical): No    Lack of Transportation (Non-Medical): No  Physical Activity: Sufficiently Active (07/28/2021)   Exercise Vital Sign    Days of Exercise per Week: 3 days    Minutes of Exercise per Session: 50 min  Stress: No Stress Concern Present (07/28/2021)    Desiree Campbell of Occupational Health - Occupational Stress Questionnaire    Feeling of Stress : Not at all  Social Connections: Moderately Integrated (07/28/2021)   Social Connection and Isolation Panel [NHANES]    Frequency of Communication with Friends and Family: Three times a week    Frequency of Social Gatherings with Friends and Family: Twice a week    Attends Religious Services: Never    Database administrator or Organizations: Yes    Attends Engineer, structural: More than 4 times per year    Marital Status: Married   Vitals:   06/01/22 1027  BP: 126/80  Pulse: 73  Resp: 12  SpO2: 99%   Body mass index is 21.58 kg/m.  Physical Exam Vitals and nursing note reviewed.  Constitutional:      General: She is not in acute distress.    Appearance: She is well-developed.  HENT:     Head: Normocephalic and atraumatic.     Mouth/Throat:     Mouth: Mucous membranes are moist.     Pharynx: Oropharynx is clear.  Eyes:     Conjunctiva/sclera: Conjunctivae normal.  Cardiovascular:     Rate and Rhythm: Normal rate and regular rhythm.     Pulses:          Dorsalis pedis pulses are 2+ on the right side and 2+ on the left side.     Heart sounds: No murmur heard. Pulmonary:     Effort: Pulmonary effort is normal. No respiratory distress.     Breath sounds: Normal breath sounds.  Abdominal:     Palpations: Abdomen is soft. There is no hepatomegaly or mass.     Tenderness: There is no abdominal tenderness.  Lymphadenopathy:     Cervical: No cervical adenopathy.  Skin:    General: Skin is warm.     Findings: No erythema or rash.  Neurological:     General: No focal deficit present.     Mental Status: She is alert and oriented to person, place, and time.     Cranial Nerves: No cranial nerve deficit.     Gait: Gait normal.  Psychiatric:     Comments: Well groomed, good eye contact.   ASSESSMENT AND PLAN:  [name] was seen today for referral.  Diagnoses and all  orders for this visit: Orders Placed This Encounter  Procedures   Comprehensive metabolic panel   Lipid panel   Ambulatory referral to Gastroenterology   Ambulatory referral to Genetics   Insomnia, unspecified type Problem has improved, she does not need Ambien very frequent. Continue Ambien 5 mg 1/2 tablet at bedtime as needed. Continue good sleep hygiene.  Hypertension, essential, benign BP adequately controlled. Continue amlodipine 10 mg daily and low-salt diet.  High risk for colon cancer -     Ambulatory referral to Gastroenterology  Abnormal genetic test -     Ambulatory referral to Genetics  Hyperlipidemia,  unspecified hyperlipidemia type Non pharmacologic treatment recommended for now. Further recommendations will be given according to 10 years CVD risk score and lipid panel numbers. She will arrange lab appt for fasting labs.  Return in about 1 year (around 06/02/2023) for CPE an df/u.  Nowell Sites G. Martinique, MD  [facility]. [place] office.

## 2022-06-01 ENCOUNTER — Encounter: Payer: Self-pay | Admitting: Family Medicine

## 2022-06-01 ENCOUNTER — Ambulatory Visit (INDEPENDENT_AMBULATORY_CARE_PROVIDER_SITE_OTHER): Payer: No Typology Code available for payment source | Admitting: Family Medicine

## 2022-06-01 VITALS — BP 126/80 | HR 73 | Resp 12 | Ht 68.5 in | Wt 144.0 lb

## 2022-06-01 DIAGNOSIS — I10 Essential (primary) hypertension: Secondary | ICD-10-CM | POA: Diagnosis not present

## 2022-06-01 DIAGNOSIS — E785 Hyperlipidemia, unspecified: Secondary | ICD-10-CM

## 2022-06-01 DIAGNOSIS — R898 Other abnormal findings in specimens from other organs, systems and tissues: Secondary | ICD-10-CM | POA: Diagnosis not present

## 2022-06-01 DIAGNOSIS — G4700 Insomnia, unspecified: Secondary | ICD-10-CM | POA: Diagnosis not present

## 2022-06-01 DIAGNOSIS — Z9189 Other specified personal risk factors, not elsewhere classified: Secondary | ICD-10-CM

## 2022-06-02 ENCOUNTER — Encounter: Payer: Self-pay | Admitting: Gastroenterology

## 2022-06-05 ENCOUNTER — Encounter: Payer: Self-pay | Admitting: Family Medicine

## 2022-06-09 ENCOUNTER — Other Ambulatory Visit (INDEPENDENT_AMBULATORY_CARE_PROVIDER_SITE_OTHER): Payer: No Typology Code available for payment source

## 2022-06-09 DIAGNOSIS — I10 Essential (primary) hypertension: Secondary | ICD-10-CM | POA: Diagnosis not present

## 2022-06-09 DIAGNOSIS — E785 Hyperlipidemia, unspecified: Secondary | ICD-10-CM

## 2022-06-09 LAB — COMPREHENSIVE METABOLIC PANEL
ALT: 11 U/L (ref 0–35)
AST: 17 U/L (ref 0–37)
Albumin: 4.7 g/dL (ref 3.5–5.2)
Alkaline Phosphatase: 51 U/L (ref 39–117)
BUN: 10 mg/dL (ref 6–23)
CO2: 30 mEq/L (ref 19–32)
Calcium: 9.7 mg/dL (ref 8.4–10.5)
Chloride: 100 mEq/L (ref 96–112)
Creatinine, Ser: 0.76 mg/dL (ref 0.40–1.20)
GFR: 95.8 mL/min (ref >60.00)
Glucose, Bld: 78 mg/dL (ref 70–99)
Potassium: 3.9 mEq/L (ref 3.5–5.1)
Sodium: 138 mEq/L (ref 135–145)
Total Bilirubin: 0.6 mg/dL (ref 0.2–1.2)
Total Protein: 8.6 g/dL — ABNORMAL HIGH (ref 6.0–8.3)

## 2022-06-09 LAB — LIPID PANEL
Cholesterol: 227 mg/dL — ABNORMAL HIGH (ref 0–200)
HDL: 73.5 mg/dL (ref >39.00)
LDL Cholesterol: 143 mg/dL — ABNORMAL HIGH (ref 0–99)
NonHDL: 153.53
Total CHOL/HDL Ratio: 3
Triglycerides: 53 mg/dL (ref 0.0–149.0)
VLDL: 10.6 mg/dL (ref 0.0–40.0)

## 2022-06-13 ENCOUNTER — Other Ambulatory Visit: Payer: Self-pay

## 2022-06-13 MED ORDER — FLUARIX QUADRIVALENT 0.5 ML IM SUSY
0.5000 mL | PREFILLED_SYRINGE | Freq: Once | INTRAMUSCULAR | 0 refills | Status: AC
  Filled 2022-06-13: qty 0.5, 1d supply, fill #0

## 2022-06-21 ENCOUNTER — Other Ambulatory Visit: Payer: Self-pay | Admitting: Family Medicine

## 2022-06-21 DIAGNOSIS — R778 Other specified abnormalities of plasma proteins: Secondary | ICD-10-CM

## 2022-07-06 ENCOUNTER — Ambulatory Visit: Payer: No Typology Code available for payment source | Admitting: Gastroenterology

## 2022-07-25 ENCOUNTER — Other Ambulatory Visit (HOSPITAL_COMMUNITY): Payer: Self-pay

## 2022-08-03 ENCOUNTER — Ambulatory Visit (INDEPENDENT_AMBULATORY_CARE_PROVIDER_SITE_OTHER): Payer: No Typology Code available for payment source | Admitting: Gastroenterology

## 2022-08-03 ENCOUNTER — Encounter: Payer: Self-pay | Admitting: Gastroenterology

## 2022-08-03 VITALS — BP 116/78 | HR 70 | Ht 68.5 in | Wt 148.5 lb

## 2022-08-03 DIAGNOSIS — Z1509 Genetic susceptibility to other malignant neoplasm: Secondary | ICD-10-CM | POA: Diagnosis not present

## 2022-08-03 NOTE — Patient Instructions (Addendum)
It was a pleasure to meet you today.   We should proceed with endoscopy for cancer screening after your consultation with genetic counseling. Please reach out to me via text or MyChart message to schedule.  We discussed trying Beano or simethicone prior to pursuing additional evaluation or treatment of gas. Other treatment options that are natural remedies including peppermint tea, peppermint oil, anise, caraway, and fennel. Over-the-counter remedies also including Pepto-Bismal and activated charcoal.

## 2022-08-03 NOTE — Progress Notes (Addendum)
Referring Provider: Martinique, Betty G, MD Primary Care Physician:  Martinique, Betty G, MD  Reason for Consultation:  PMS2   IMPRESSION:  PMS2-associated Lynch Syndrome by genetic testing. Unclear whether her is variant is actually connected to a health condition as her family history does not meet [place] criteria. I have recommend genetic counseling to clarify her risk and screening recommendations.   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  The history is obtained through the patient and review of her electronic health record. She has mild anxiety, hypercholesterolemia, and hypertension.   Genetic testing performed through OB/GYN was positive for PMS2 mutation.   Father had colon polyps. Mom has not had a colonoscopy.  There is no known family history of colon cancer. Breast cancer on Dad's side. Two first cousins may have had endometrial cancer. Mom and Dad do not want to have genetic testing.  She notes a lot gas. Follows a largely vegetarian diet. GI ROS is otherwise negative.   There is no known family history of colon cancer or polyps. No family history of stomach cancer or other GI malignancy. No family history of inflammatory bowel disease or celiac.    Past Medical History:  Diagnosis Date   Anxiety    Hypertension    Positive TB test     History reviewed. No pertinent surgical history.  Prior to Admission medications   Medication Sig Start Date End Date Taking? Authorizing Provider  amLODipine (NORVASC) 10 MG tablet Take 1 tablet (10 mg total) by mouth daily. 07/31/21  Yes Martinique, Betty G, MD  propranolol (INDERAL) 20 MG tablet Take 1 tablet (20 mg total) by mouth daily as needed. 03/22/21  Yes Martinique, Betty G, MD  zolpidem (AMBIEN) 5 MG tablet Take 0.5-1  tablets (2.5-5 mg total) by mouth at bedtime as needed for sleep. 03/04/22  Yes Martinique, Betty G, MD    Current Outpatient Medications  Medication Sig Dispense Refill   amLODipine (NORVASC) 10 MG tablet Take 1 tablet (10 mg total) by mouth daily. 90 tablet 3   propranolol (INDERAL) 20 MG tablet Take 1 tablet (20 mg total) by mouth daily as needed. 90 tablet 3   zolpidem (AMBIEN) 5 MG tablet Take 0.5-1 tablets (2.5-5 mg total) by mouth at bedtime as needed for sleep. 15 tablet 1   No current facility-administered medications for this visit.    Allergies as of 08/03/2022   (No Known Allergies)    Family History  Problem Relation Age of Onset   Diabetes Mother    Hyperlipidemia Mother    Miscarriages / [place] Mother    Graves' disease Mother    Hyperlipidemia Father    Arthritis Father    Hyperlipidemia Sister    Breast cancer Paternal Aunt    Alcohol abuse Maternal Grandmother    Arthritis Paternal Grandmother    Cancer Paternal Grandmother    Breast cancer Paternal Grandmother     Social History   Socioeconomic History   Marital status: Married    Spouse name: Not on file   Number of children: 2   Years of education: Not on file   Highest education level: Professional school degree (e.g., MD, DDS, DVM, JD)  Occupational History   Occupation: Pediatrician  Tobacco Use   Smoking status: Never   Smokeless tobacco:  Never  Vaping Use   Vaping Use: Never used  Substance and Sexual Activity   Alcohol use: Yes   Drug use: No   Sexual activity: Not on file  Other Topics Concern   Not on file  Social History Narrative   Not on file   Social Determinants of Health   Financial Resource Strain: Low Risk  (07/28/2021)   Overall Financial Resource Strain (CARDIA)    Difficulty of Paying Living Expenses: Not hard at all  Food Insecurity: No Food Insecurity (07/28/2021)   Hunger Vital Sign    Worried About Running Out of Food in the Last Year: Never true    Ran Out of  Food in the Last Year: Never true  Transportation Needs: No Transportation Needs (07/28/2021)   PRAPARE - Hydrologist (Medical): No    Lack of Transportation (Non-Medical): No  Physical Activity: Sufficiently Active (07/28/2021)   Exercise Vital Sign    Days of Exercise per Week: 3 days    Minutes of Exercise per Session: 50 min  Stress: No Stress Concern Present (07/28/2021)   [place]    Feeling of Stress : Not at all  Social Connections: Moderately Integrated (07/28/2021)   Social Connection and Isolation Panel [NHANES]    Frequency of Communication with Friends and Family: Three times a week    Frequency of Social Gatherings with Friends and Family: Twice a week    Attends Religious Services: Never    Marine scientist or Organizations: Yes    Attends Music therapist: More than 4 times per year    Marital Status: Married  Human resources officer Violence: Not on file    Review of Systems: 12 system ROS is negative except as noted above.   Physical Exam: General:   Alert,  well-nourished, pleasant and cooperative in NAD Head:  Normocephalic and atraumatic. Eyes:  Sclera clear, no icterus.   Conjunctiva pink. Ears:  Normal auditory acuity. Nose:  No deformity, discharge,  or lesions. Mouth:  No deformity or lesions.   Neck:  Supple; no masses or thyromegaly. Lungs:  Clear throughout to auscultation.   No wheezes. Heart:  Regular rate and rhythm; no murmurs. Abdomen:  Soft, nontender, nondistended, normal bowel sounds, no rebound or guarding. No hepatosplenomegaly.   Rectal:  Deferred  Msk:  Symmetrical. No boney deformities LAD: No inguinal or umbilical LAD Extremities:  No clubbing or edema. Neurologic:  Alert and  oriented x4;  grossly nonfocal Skin:  Intact without significant lesions or rashes. Psych:  Alert and cooperative. Normal mood and  affect.    Graycen Sadlon L. Tarri Glenn, MD, MPH 08/03/2022, 1:21 PM

## 2022-10-13 ENCOUNTER — Inpatient Hospital Stay: Payer: 59

## 2022-10-13 ENCOUNTER — Inpatient Hospital Stay: Payer: 59 | Attending: Nurse Practitioner | Admitting: Genetic Counselor

## 2022-10-13 ENCOUNTER — Encounter: Payer: Self-pay | Admitting: Genetic Counselor

## 2022-10-13 ENCOUNTER — Telehealth: Payer: Self-pay | Admitting: *Deleted

## 2022-10-13 ENCOUNTER — Other Ambulatory Visit: Payer: Self-pay

## 2022-10-13 DIAGNOSIS — Z1509 Genetic susceptibility to other malignant neoplasm: Secondary | ICD-10-CM

## 2022-10-13 NOTE — Telephone Encounter (Signed)
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  She stated that what was faxed over did not have the results.   Please contact Desiree Campbell in the morning at [phone].

## 2022-10-13 NOTE — Progress Notes (Signed)
REFERRING PROVIDER: Martinique, Betty G, MD [address] [place],  [place] [address]  PRIMARY PROVIDER:  Martinique, Betty G, MD  PRIMARY REASON FOR VISIT:  Encounter Diagnosis  Name Primary?   Monoallelic mutation of PMS2 gene Yes   HISTORY OF PRESENT ILLNESS:   Ms. Desiree Campbell, a 42 y.o. female, was seen for a [place] cancer genetics consultation at the request of Dr. Martinique due to a personal history of a PMS2 gene mutation.  Ms. Desiree Campbell presents to clinic today to discuss her genetic testing results and recommendations.   Ms. Desiree Campbell is a 42 y.o. female with no personal history of cancer.    RISK FACTORS:  Menarche was at age 24.  First live birth at age 29.  OCP use for approximately <1 year Ovaries intact: yes.  Uterus intact: yes.  Menopausal status: premenopausal.  HRT use: 0 years. Colonoscopy: no Mammogram within the last year: yes. Number of breast biopsies: 0. Up to date with pelvic exams: yes. Any excessive radiation exposure in the past: no  Past Medical History:  Diagnosis Date   Anxiety    Hypertension    Positive TB test     No past surgical history on file.  Social History   Socioeconomic History   Marital status: Married    Spouse name: Not on file   Number of children: 2   Years of education: Not on file   Highest education level: Professional school degree (e.g., MD, DDS, DVM, JD)  Occupational History   Occupation: Pediatrician  Tobacco Use   Smoking status: Never   Smokeless tobacco: Never  Vaping Use   Vaping Use: Never used  Substance and Sexual Activity   Alcohol use: Yes   Drug use: No   Sexual activity: Not on file  Other Topics Concern   Not on file  Social History Narrative   Not on file   Social Determinants of Health   Financial Resource Strain: Low Risk  (07/28/2021)   Overall Financial Resource Strain (CARDIA)    Difficulty of Paying Living Expenses: Not hard at all  Food Insecurity: No Food Insecurity  (07/28/2021)   Hunger Vital Sign    Worried About Running Out of Food in the Last Year: Never true    [place] in the Last Year: Never true  Transportation Needs: No Transportation Needs (07/28/2021)   PRAPARE - Hydrologist (Medical): No    Lack of Transportation (Non-Medical): No  Physical Activity: Sufficiently Active (07/28/2021)   Exercise Vital Sign    Days of Exercise per Week: 3 days    Minutes of Exercise per Session: 50 min  Stress: No Stress Concern Present (07/28/2021)   [place]    Feeling of Stress : Not at all  Social Connections: Moderately Integrated (07/28/2021)   Social Connection and Isolation Panel [NHANES]    Frequency of Communication with Friends and Family: Three times a week    Frequency of Social Gatherings with Friends and Family: Twice a week    Attends Religious Services: Never    Marine scientist or Organizations: Yes    Attends Music therapist: More than 4 times per year    Marital Status: Married     FAMILY HISTORY:  We obtained a detailed, 4-generation family history.  Significant diagnoses are listed below: Family History  Problem Relation Age of Onset   Diabetes Mother  Hyperlipidemia Mother    Miscarriages / [place] Mother    Berenice Primas' disease Mother    Hyperlipidemia Father    Arthritis Father    Hyperlipidemia Sister    Breast cancer Paternal Aunt 81 - 59   Alcohol abuse Maternal Grandmother    Arthritis Paternal Grandmother    Breast cancer Paternal Grandmother 7 - 74   Ovarian cancer Other 3 - 61       maternal half-aunt   Uterine cancer Cousin        paternal half-cousin       Ms. Desiree Campbell maternal half-aunt was diagnosed with ovarian cancer in her 50s/60s, she is deceased. Her paternal aunt was diagnosed with breast cancer in her late 27s, she died at age 61 due to metastatic breast cancer. Ms.  Desiree Campbell paternal grandmother was diagnosed with breast cancer in her 41s. Ms. Desiree Campbell has two paternal half uncles and both uncles had a daughter diagnosed with cancer (Ms. Desiree Campbell half-cousins), one was diagnosed with uterine cancer and the other was diagnosed with an unknown type of cancer, they are both deceased.  GENETIC COUNSELING ASSESSMENT: Ms. Desiree Campbell is a 42 y.o. female with a personal history of a PMS2 gene mutation. Therefore, we reviewed the cancer risks and management recommendations associated with PMS2 as noted below.   Clinical Information Pathogenic variants in the PMS2 gene are associated with Lynch syndrome.  The cancers associated with PMS2 are: Colorectal cancer, 8.7-20% risk (average age of diagnosis is 51-66) Endometrial cancer, 13-26% risk (average age of diagnosis is 76-50) Ovarian cancer, 1.3-3% risk (average age of diagnosis is 28-59) Renal pelvis and/or ureter, up to a 3.7% risk (average age of diagnosis is unknown) Breast, prostate, gastric, small bowel, bladder, biliary tract, and brain cancer risk may be elevated  Management Recommendations:  Colorectal Cancer Screening: High quality colonoscopy at age 73-35 or 2-5 years prior to the earliest colon cancer if it is diagnosed before age 92 and repeat every 1-3 years Studies have demonstrated that the use of daily aspirin decreases the colorectal cancer risk in patients with Lynch Syndrome. Ongoing studies are investigating the optimal dose and duration of use of aspirin for colon cancer prevention in Lynch Syndrome. The decision to use aspirin should be made on an individualized basis including a discussion of dose, benefits, and adverse effects.    Endometrial Cancer Screening/Risk Reduction: Women should report any abnormal uterine bleeding or postmenopausal bleeding. The evaluation of these symptoms should include an endometrial biopsy. A hysterectomy may be considered. The timing should be  individualized based on whether childbearing is complete, comorbidities, and family history. Endometrial cancer screening does not have a proven benefit in women with Lynch Syndrome. However, endometrial biopsy is highly sensitive and specific as a diagnostic procedure. Screening via endometrial biopsy every 1-2 years starting at age 53-35 can be considered. Transvaginal ultrasounds may be considered in postmenopausal women at their clinician's discretion.  Ovarian Cancer Screening/Risk Reduction: There is currently insufficient evidence to make a specific recommendation for a prophylactic bilateral salpingo-oophorectomy (BSO), or having the ovaries and fallopian tubes removed, for individuals who carry a PMS2 pathogenic variant. Current data does not support routine ovarian screening for Lynch syndrome, therefore it may be considered at the clinician's discretion. Screening includes transvaginal ultrasounds and a blood test to measure CA-125 levels every 6-12 months. If there is a family history of ovarian cancer, have a discussion with your physician about the benefits and limitations of screening and risk reduction strategies.  Women should  be aware of symptoms that might be associated with the development of ovarian cancer including pelvic or abdominal pain, bloating, increased abdominal girth, difficulty eating, early satiety, or urinary frequency or urgency. Symptoms that persist for several weeks and are a change from a woman's baseline should prompt evaluation by her physician.  Urothelial Cancer Screening: Annual urinalysis starting at age 88-35 may be considered in selected individuals such as those with a family history of urothelial cancer (renal pelvis, ureter, and/or bladder).  Gastric and Small Bowel Cancer Screening: Consider upper GI surveillance with EGD starting at age 88-40 years and repeat every 2-4 years, preferably performed in conjunction with colonoscopy.  Random biopsy of the  proximal and distal stomach should at minimum be performed on the initial procedure to assess for H. pylori, autoimmune gastritis, and intestinal metaplasia. Push enteroscopy can be considered in place of EGD to enhance small bowel visualization, although its incremental yield for detection of neoplasia over EGD remains uncertain. Individuals not undergoing upper endoscopic surveillance should have one-time noninvasive testing for H. pylori at the time of Lynch Syndrome diagnosis, with treatment indicated if H. pylori is detected.  Prostate Cancer Screening: Consider beginning annual PSA blood test and digital rectal exams at age 71.  Breast Cancer Screening/Risk Reduction: Breast awareness beginning at age 45 Monthly self-breast examination beginning at age 10 Annual clinical breast examinations beginning at age 58 Annual mammogram beginning at age 80 with consideration of tomosynthesis Evidence is insufficient for a prophylactic risk-reducing mastectomy, manage based on family history    Brain Cancer Screening: Patients should be educated regarding signs and symptoms of neurologic cancer and the importance of prompt reporting of abnormal symptoms to their physicians.  Skin Cancer Screening: Frequency of malignant and benign skin tumors such as sebaceous adenocarcinomas, sebaceous adenomas, and keratoacanthomas has been reported to be increased among patients with Lynch Syndrome, but cumulative lifetime risk and median age of presentation are uncertain. Further, an elevated risk of sebaceous tumors and keratoacanthoma has not been documented for PMS2 carriers. Consider skin exam every 1-2 years with a health care provider skilled in [place] skin manifestations. Age to start surveillance is uncertain and can be individualized.  Reproductive Considerations: Patients of reproductive age should be made aware of options for prenatal diagnosis and assisted reproduction  including pre-implantation genetic diagnosis.  Individuals with a single pathogenic PMS2 variant are also carriers of constitutional MMR deficiency (CMMRD) syndrome. CMMRD is a childhood-onset cancer predisposition syndrome that can present with hematological malignancies, cancers of the brain and central nervous system, Lynch syndrome-associated cancers (colon, uterine, small bowel, urinary tract), embryonic tumors, and sarcomas. Some affected individuals may also display cafe-au-lait macules. For there to be a risk of CMMRD in offspring, an individual and their partner would each have to have a single pathogenic variant in the same MMR gene; in such a case, the risk of having an affected child is 25%.   This information is based on current understanding of the gene and may change in the future.  Implications for Family Members: Hereditary predisposition to cancer due to pathogenic variants in the PMS2 gene has autosomal dominant inheritance. This means that an individual with a pathogenic variant has a 50% chance of passing the condition on to his/her offspring. Most cases are inherited from a parent, but some cases may occur spontaneously (i.e., an individual with a pathogenic variant has parents who do not have it). Identification of a pathogenic variant allows for the recognition of at-risk relatives  who can pursue testing for the familial variant.  Family members are encouraged to consider genetic testing for this familial pathogenic variant. As there are generally no childhood cancer risks associated with pathogenic variants in the PSM2 gene, individuals in the family are not recommended to have testing until they reach at least 44 years of age. They may contact our office at [phone] for more information or to schedule an appointment. Family members who live outside of the area are encouraged to find a genetic counselor in their area by visiting:  [email or web address].  Additional Information: Breast Cancer Screening:  The Tyrer-Cuzick model is one of multiple prediction models developed to estimate an individual's lifetime risk of developing breast cancer. The Tyrer-Cuzick model is endorsed by the [employer]  (NCCN). This model includes many risk factors such as family history, endogenous estrogen exposure, and benign breast disease. The calculation is highly-dependent on the accuracy of clinical data provided by the patient and can change over time. The Tyrer-Cuzick model may be repeated to reflect new information in her personal or family history in the future.   Ms. Eustace Moore risk score is 16.4%. This is above the general population risk of 12%, therefore, she is encouraged to continue to be mindful of her family history and be diligent with general population breast screening, including annual mammograms. She is encouraged to contact us regarding any changes to her personal or family history, as her recommendations for screening would be altered significantly if her lifetime risk is determined to be greater than 20% based on updated information.    Ms. Desiree Campbell questions were answered to her satisfaction today. Our contact information was provided should additional questions or concerns arise. Thank you for the referral and allowing [place] to share in the care of your patient.   Lucille Passy, MS, [facility] Genetic Counselor [email or web address] (P) [phone]  The patient was seen for a total of 40 minutes in face-to-face genetic counseling. The patient was seen alone.  Drs. Lindi Adie and/or Burr Medico were available to discuss this case as needed.   _______________________________________________________________________ For Office Staff:  Number of people involved in session: 1 Was an Intern/ student involved with case: no

## 2022-10-14 NOTE — Telephone Encounter (Signed)
I left Desiree Campbell a message to call the office back. We just need her fax number to send over the results.

## 2022-10-14 NOTE — Telephone Encounter (Signed)
Sent via epic 

## 2022-10-22 ENCOUNTER — Other Ambulatory Visit (HOSPITAL_COMMUNITY): Payer: Self-pay

## 2022-11-01 ENCOUNTER — Other Ambulatory Visit: Payer: Self-pay | Admitting: Family Medicine

## 2022-11-01 DIAGNOSIS — I10 Essential (primary) hypertension: Secondary | ICD-10-CM

## 2022-11-02 ENCOUNTER — Other Ambulatory Visit (HOSPITAL_COMMUNITY): Payer: Self-pay

## 2022-11-02 MED ORDER — AMLODIPINE BESYLATE 10 MG PO TABS
10.0000 mg | ORAL_TABLET | Freq: Every day | ORAL | 3 refills | Status: DC
  Filled 2022-11-02 – 2022-11-21 (×2): qty 90, 90d supply, fill #0
  Filled 2023-03-21: qty 90, 90d supply, fill #1
  Filled 2023-06-15: qty 90, 90d supply, fill #2

## 2022-11-04 NOTE — Progress Notes (Deleted)
   ACUTE VISIT No chief complaint on file.  HPI: [name] is a 42 y.o. female, who is here today complaining of *** HPI  Review of Systems See other pertinent positives and negatives in HPI.  Current Outpatient Medications on File Prior to Visit  Medication Sig Dispense Refill   amLODipine (NORVASC) 10 MG tablet Take 1 tablet (10 mg total) by mouth daily. 90 tablet 3   propranolol (INDERAL) 20 MG tablet Take 1 tablet (20 mg total) by mouth daily as needed. 90 tablet 3   zolpidem (AMBIEN) 5 MG tablet Take 0.5-1 tablets (2.5-5 mg total) by mouth at bedtime as needed for sleep. 15 tablet 1   No current facility-administered medications on file prior to visit.    Past Medical History:  Diagnosis Date   Anxiety    Hypertension    Positive TB test    No Known Allergies  Social History   Socioeconomic History   Marital status: Married    Spouse name: Not on file   Number of children: 2   Years of education: Not on file   Highest education level: Professional school degree (e.g., MD, DDS, DVM, JD)  Occupational History   Occupation: Pediatrician  Tobacco Use   Smoking status: Never   Smokeless tobacco: Never  Vaping Use   Vaping Use: Never used  Substance and Sexual Activity   Alcohol use: Yes   Drug use: No   Sexual activity: Not on file  Other Topics Concern   Not on file  Social History Narrative   Not on file   Social Determinants of Health   Financial Resource Strain: Low Risk  (07/28/2021)   Overall Financial Resource Strain (CARDIA)    Difficulty of Paying Living Expenses: Not hard at all  Food Insecurity: No Food Insecurity (07/28/2021)   Hunger Vital Sign    Worried About Running Out of Food in the Last Year: Never true    [place] in the Last Year: Never true  Transportation Needs: No Transportation Needs (07/28/2021)   PRAPARE - Hydrologist (Medical): No    Lack of Transportation  (Non-Medical): No  Physical Activity: Sufficiently Active (07/28/2021)   Exercise Vital Sign    Days of Exercise per Week: 3 days    Minutes of Exercise per Session: 50 min  Stress: No Stress Concern Present (07/28/2021)   [place]    Feeling of Stress : Not at all  Social Connections: Moderately Integrated (07/28/2021)   Social Connection and Isolation Panel [NHANES]    Frequency of Communication with Friends and Family: Three times a week    Frequency of Social Gatherings with Friends and Family: Twice a week    Attends Religious Services: Never    Marine scientist or Organizations: Yes    Attends Music therapist: More than 4 times per year    Marital Status: Married    There were no vitals filed for this visit. There is no height or weight on file to calculate BMI.  Physical Exam  ASSESSMENT AND PLAN: There are no diagnoses linked to this encounter.  No follow-ups on file.  Desiree G. Martinique, MD  [facility]. [place] office.  Discharge Instructions   None

## 2022-11-07 ENCOUNTER — Ambulatory Visit: Payer: 59 | Admitting: Family Medicine

## 2022-11-08 NOTE — Progress Notes (Unsigned)
   ACUTE VISIT No chief complaint on file.  HPI: Desiree Campbell is a 42 y.o. female, who is here today complaining of *** HPI  Review of Systems See other pertinent positives and negatives in HPI.  Current Outpatient Medications on File Prior to Visit  Medication Sig Dispense Refill   amLODipine (NORVASC) 10 MG tablet Take 1 tablet (10 mg total) by mouth daily. 90 tablet 3   propranolol (INDERAL) 20 MG tablet Take 1 tablet (20 mg total) by mouth daily as needed. 90 tablet 3   zolpidem (AMBIEN) 5 MG tablet Take 0.5-1 tablets (2.5-5 mg total) by mouth at bedtime as needed for sleep. 15 tablet 1   No current facility-administered medications on file prior to visit.    Past Medical History:  Diagnosis Date   Anxiety    Hypertension    Positive TB test    No Known Allergies  Social History   Socioeconomic History   Marital status: Married    Spouse name: Not on file   Number of children: 2   Years of education: Not on file   Highest education level: Professional school degree (e.g., MD, DDS, DVM, JD)  Occupational History   Occupation: Pediatrician  Tobacco Use   Smoking status: Never   Smokeless tobacco: Never  Vaping Use   Vaping Use: Never used  Substance and Sexual Activity   Alcohol use: Yes   Drug use: No   Sexual activity: Not on file  Other Topics Concern   Not on file  Social History Narrative   Not on file   Social Determinants of Health   Financial Resource Strain: Low Risk  (07/28/2021)   Overall Financial Resource Strain (CARDIA)    Difficulty of Paying Living Expenses: Not hard at all  Food Insecurity: No Food Insecurity (07/28/2021)   Hunger Vital Sign    Worried About Running Out of Food in the Last Year: Never true    [place] in the Last Year: Never true  Transportation Needs: No Transportation Needs (07/28/2021)   PRAPARE - Hydrologist (Medical): No    Lack of Transportation  (Non-Medical): No  Physical Activity: Sufficiently Active (07/28/2021)   Exercise Vital Sign    Days of Exercise per Week: 3 days    Minutes of Exercise per Session: 50 min  Stress: No Stress Concern Present (07/28/2021)   [place]    Feeling of Stress : Not at all  Social Connections: Moderately Integrated (07/28/2021)   Social Connection and Isolation Panel [NHANES]    Frequency of Communication with Friends and Family: Three times a week    Frequency of Social Gatherings with Friends and Family: Twice a week    Attends Religious Services: Never    Marine scientist or Organizations: Yes    Attends Music therapist: More than 4 times per year    Marital Status: Married    There were no vitals filed for this visit. There is no height or weight on file to calculate BMI.  Physical Exam  ASSESSMENT AND PLAN: There are no diagnoses linked to this encounter.  No follow-ups on file.  Roena Sassaman G. Martinique, MD  [facility]. [place] office.  Discharge Instructions   None

## 2022-11-09 ENCOUNTER — Encounter: Payer: Self-pay | Admitting: Family Medicine

## 2022-11-09 ENCOUNTER — Ambulatory Visit (INDEPENDENT_AMBULATORY_CARE_PROVIDER_SITE_OTHER): Payer: 59 | Admitting: Family Medicine

## 2022-11-09 VITALS — BP 118/78 | HR 70 | Temp 98.5°F | Resp 12 | Ht 68.5 in | Wt 148.4 lb

## 2022-11-09 DIAGNOSIS — H6991 Unspecified Eustachian tube disorder, right ear: Secondary | ICD-10-CM

## 2022-11-09 NOTE — Patient Instructions (Addendum)
A few things to remember from today's visit:  Dysfunction of right eustachian tube  If you need refills for medications you take chronically, please call your pharmacy. Do not use My Chart to request refills or for acute issues that need immediate attention. If you send a my chart message, it may take a few days to be addressed, specially if I am not in the office.  Please be sure medication list is accurate. If a new problem present, please set up appointment sooner than planned today.

## 2022-11-15 ENCOUNTER — Other Ambulatory Visit (HOSPITAL_COMMUNITY): Payer: Self-pay

## 2022-11-21 ENCOUNTER — Other Ambulatory Visit (HOSPITAL_COMMUNITY): Payer: Self-pay

## 2023-03-29 ENCOUNTER — Encounter (INDEPENDENT_AMBULATORY_CARE_PROVIDER_SITE_OTHER): Payer: Self-pay

## 2023-04-10 ENCOUNTER — Other Ambulatory Visit: Payer: Self-pay | Admitting: Family Medicine

## 2023-04-10 DIAGNOSIS — G4700 Insomnia, unspecified: Secondary | ICD-10-CM

## 2023-04-12 ENCOUNTER — Ambulatory Visit (AMBULATORY_SURGERY_CENTER): Payer: 59

## 2023-04-12 ENCOUNTER — Encounter: Payer: Self-pay | Admitting: Internal Medicine

## 2023-04-12 ENCOUNTER — Other Ambulatory Visit (HOSPITAL_COMMUNITY): Payer: Self-pay

## 2023-04-12 VITALS — Ht 68.5 in | Wt 150.0 lb

## 2023-04-12 DIAGNOSIS — Z1211 Encounter for screening for malignant neoplasm of colon: Secondary | ICD-10-CM

## 2023-04-12 MED ORDER — NA SULFATE-K SULFATE-MG SULF 17.5-3.13-1.6 GM/177ML PO SOLN
1.0000 | Freq: Once | ORAL | 0 refills | Status: AC
  Filled 2023-04-12: qty 354, 2d supply, fill #0

## 2023-04-12 NOTE — Progress Notes (Signed)
Pre visit completed via phone call; Patient verified name, DOB, and address;  No egg or soy allergy known to patient  No issues known to pt with past sedation with any surgeries or procedures Patient denies ever being told they had issues or difficulty with intubation  No FH of Malignant Hyperthermia Pt is not on diet pills Pt is not on home 02  Pt is not on blood thinners  Pt denies issues with constipation;  No A fib or A flutter  Have any cardiac testing pending--NO Insurance verified during PV appt--- [employer]  Pt can ambulate without assistance;  Pt denies use of chewing tobacco Discussed diabetic/weight loss medication holds; Discussed NSAID holds; Checked BMI to be less than 50; Pt instructed to use Single[employer].com or GoodRx for a price reduction on prep;   Pre visit completed and red dot placed by patient's name on their procedure day (on provider's schedule).    Instructions sent to patient during PV via MyChart;

## 2023-04-18 NOTE — Progress Notes (Unsigned)
HPI: [name] is a 42 y.o. female, who is here today for her routine physical.  Last CPE: ***  Regular exercise 3 or more time per week: *** Following a healthy diet: ***  Chronic medical problems: ***  Immunization History  Administered Date(s) Administered   Influenza,inj,Quad PF,6+ Mos 06/13/2022   Influenza-Unspecified 05/20/2021   PFIZER(Purple Top)SARS-COV-2 Vaccination 07/06/2020   Health Maintenance  Topic Date Due   HIV Screening  Never done   Hepatitis C Screening  Never done   DTaP/Tdap/Td (1 - Tdap) Never done   PAP SMEAR-Modifier  Never done   COVID-19 Vaccine (2 - 2023-24 season) 04/29/2022   INFLUENZA VACCINE  03/30/2023   HPV VACCINES  Aged Out    She has *** concerns today.  Review of Systems  Current Outpatient Medications on File Prior to Visit  Medication Sig Dispense Refill   amLODipine (NORVASC) 10 MG tablet Take 1 tablet (10 mg total) by mouth daily. 90 tablet 3   propranolol (INDERAL) 20 MG tablet Take 1 tablet (20 mg total) by mouth daily as needed. 90 tablet 3   zolpidem (AMBIEN) 5 MG tablet Take 0.5-1 tablets (2.5-5 mg total) by mouth at bedtime as needed for sleep. 15 tablet 1   No current facility-administered medications on file prior to visit.    Past Medical History:  Diagnosis Date   Anxiety    hx of   Hyperlipidemia    diet controlled   Hypertension    on meds   Lynch syndrome    Positive TB test    cleared by cxr per patient    Past Surgical History:  Procedure Laterality Date   EYE SURGERY  1983   WISDOM TOOTH EXTRACTION  2015    No Known Allergies  Family History  Problem Relation Age of Onset   Diabetes Mother    Hyperlipidemia Mother    Miscarriages / Stillbirths Mother    Graves' disease Mother    Colon polyps Father 50   Hyperlipidemia Father    Arthritis Father    Hyperlipidemia Sister    Breast cancer Paternal Aunt 14 - 41   Alcohol abuse Maternal Grandmother    Arthritis  Paternal Grandmother    Breast cancer Paternal Grandmother 59 - 66   Uterine cancer Cousin        paternal half-cousin   Ovarian cancer Other 77 - 50       maternal half-aunt    Social History   Socioeconomic History   Marital status: Married    Spouse name: Not on file   Number of children: 2   Years of education: Not on file   Highest education level: Professional school degree (e.g., MD, DDS, DVM, JD)  Occupational History   Occupation: Pediatrician  Tobacco Use   Smoking status: Never   Smokeless tobacco: Never  Vaping Use   Vaping status: Never Used  Substance and Sexual Activity   Alcohol use: Not Currently   Drug use: No   Sexual activity: Not on file  Other Topics Concern   Not on file  Social History Narrative   Not on file   Social Determinants of Health   Financial Resource Strain: Low Risk  (07/28/2021)   Overall Financial Resource Strain (CARDIA)    Difficulty of Paying Living Expenses: Not hard at all  Food Insecurity: No Food Insecurity (07/28/2021)   Hunger Vital Sign    Worried About Running Out of Food in the Last Year: Never  true    Ran Out of Food in the Last Year: Never true  Transportation Needs: No Transportation Needs (07/28/2021)   PRAPARE - Administrator, Civil Service (Medical): No    Lack of Transportation (Non-Medical): No  Physical Activity: Sufficiently Active (07/28/2021)   Exercise Vital Sign    Days of Exercise per Week: 3 days    Minutes of Exercise per Session: 50 min  Stress: No Stress Concern Present (07/28/2021)   [employer] of Occupational Health - Occupational Stress Questionnaire    Feeling of Stress : Not at all  Social Connections: Moderately Integrated (07/28/2021)   Social Connection and Isolation Panel [NHANES]    Frequency of Communication with Friends and Family: Three times a week    Frequency of Social Gatherings with Friends and Family: Twice a week    Attends Religious Services: Never     Database administrator or Organizations: Yes    Attends Engineer, structural: More than 4 times per year    Marital Status: Married    There were no vitals filed for this visit. There is no height or weight on file to calculate BMI.  Wt Readings from Last 3 Encounters:  04/12/23 150 lb (68 kg)  11/09/22 148 lb 6 oz (67.3 kg)  08/03/22 148 lb 8 oz (67.4 kg)    Physical Exam  ASSESSMENT AND PLAN: Ms. Desiree Campbell was here today annual physical examination.  No orders of the defined types were placed in this encounter.   There are no diagnoses linked to this encounter.  There are no diagnoses linked to this encounter.  No follow-ups on file.  Raynell Scott G. [place], MD  [facility]. [place] [facility].

## 2023-04-19 ENCOUNTER — Ambulatory Visit (INDEPENDENT_AMBULATORY_CARE_PROVIDER_SITE_OTHER): Payer: 59 | Admitting: Family Medicine

## 2023-04-19 ENCOUNTER — Other Ambulatory Visit: Payer: Self-pay

## 2023-04-19 ENCOUNTER — Other Ambulatory Visit (HOSPITAL_COMMUNITY): Payer: Self-pay

## 2023-04-19 ENCOUNTER — Encounter: Payer: Self-pay | Admitting: Family Medicine

## 2023-04-19 VITALS — BP 122/78 | HR 79 | Temp 98.6°F | Ht 68.7 in | Wt 149.4 lb

## 2023-04-19 DIAGNOSIS — Z0000 Encounter for general adult medical examination without abnormal findings: Secondary | ICD-10-CM | POA: Diagnosis not present

## 2023-04-19 DIAGNOSIS — G4700 Insomnia, unspecified: Secondary | ICD-10-CM

## 2023-04-19 DIAGNOSIS — E785 Hyperlipidemia, unspecified: Secondary | ICD-10-CM | POA: Diagnosis not present

## 2023-04-19 DIAGNOSIS — L659 Nonscarring hair loss, unspecified: Secondary | ICD-10-CM | POA: Diagnosis not present

## 2023-04-19 DIAGNOSIS — Z1159 Encounter for screening for other viral diseases: Secondary | ICD-10-CM | POA: Diagnosis not present

## 2023-04-19 DIAGNOSIS — I10 Essential (primary) hypertension: Secondary | ICD-10-CM | POA: Diagnosis not present

## 2023-04-19 LAB — LIPID PANEL
Cholesterol: 282 mg/dL — ABNORMAL HIGH (ref 0–200)
HDL: 71 mg/dL (ref >39.00)
LDL Cholesterol: 195 mg/dL — ABNORMAL HIGH (ref 0–99)
NonHDL: 211.4
Total CHOL/HDL Ratio: 4
Triglycerides: 82 mg/dL (ref 0.0–149.0)
VLDL: 16.4 mg/dL (ref 0.0–40.0)

## 2023-04-19 LAB — BASIC METABOLIC PANEL
BUN: 9 mg/dL (ref 6–23)
CO2: 32 mEq/L (ref 19–32)
Calcium: 10.4 mg/dL (ref 8.4–10.5)
Chloride: 100 mEq/L (ref 96–112)
Creatinine, Ser: 0.81 mg/dL (ref 0.40–1.20)
GFR: 88.22 mL/min (ref >60.00)
Glucose, Bld: 92 mg/dL (ref 70–99)
Potassium: 4.1 mEq/L (ref 3.5–5.1)
Sodium: 138 mEq/L (ref 135–145)

## 2023-04-19 MED ORDER — ZOLPIDEM TARTRATE ER 6.25 MG PO TBCR
6.2500 mg | EXTENDED_RELEASE_TABLET | Freq: Every evening | ORAL | 0 refills | Status: DC | PRN
  Filled 2023-04-19: qty 30, 30d supply, fill #0

## 2023-04-19 MED ORDER — MINOXIDIL 2.5 MG PO TABS
1.2500 mg | ORAL_TABLET | Freq: Every day | ORAL | 1 refills | Status: DC
  Filled 2023-04-19: qty 30, 60d supply, fill #0
  Filled 2023-06-15: qty 30, 60d supply, fill #1

## 2023-04-19 NOTE — Patient Instructions (Addendum)
A few things to remember from today's visit:  Encounter for HCV screening test for low risk patient - Plan: Hepatitis C antibody  Hyperlipidemia, unspecified hyperlipidemia type - Plan: Lipid panel  Hypertension, essential, benign - Plan: Basic metabolic panel  Routine general medical examination at a health care facility  If you need refills for medications you take chronically, please call your pharmacy. Do not use My Chart to request refills or for acute issues that need immediate attention. If you send a my chart message, it may take a few days to be addressed, specially if I am not in the office.  Please be sure medication list is accurate. If a new problem present, please set up appointment sooner than planned today.  Health Maintenance, Female Adopting a healthy lifestyle and getting preventive care are important in promoting health and wellness. Ask your health care provider about: The right schedule for you to have regular tests and exams. Things you can do on your own to prevent diseases and keep yourself healthy. What should I know about diet, weight, and exercise? Eat a healthy diet  Eat a diet that includes plenty of vegetables, fruits, low-fat dairy products, and lean protein. Do not eat a lot of foods that are high in solid fats, added sugars, or sodium. Maintain a healthy weight Body mass index (BMI) is used to identify weight problems. It estimates body fat based on height and weight. Your health care provider can help determine your BMI and help you achieve or maintain a healthy weight. Get regular exercise Get regular exercise. This is one of the most important things you can do for your health. Most adults should: Exercise for at least 150 minutes each week. The exercise should increase your heart rate and make you sweat (moderate-intensity exercise). Do strengthening exercises at least twice a week. This is in addition to the moderate-intensity exercise. Spend  less time sitting. Even light physical activity can be beneficial. Watch cholesterol and blood lipids Have your blood tested for lipids and cholesterol at 44 years of age, then have this test every 5 years. Have your cholesterol levels checked more often if: Your lipid or cholesterol levels are high. You are older than 44 years of age. You are at high risk for heart disease. What should I know about cancer screening? Depending on your health history and family history, you may need to have cancer screening at various ages. This may include screening for: Breast cancer. Cervical cancer. Colorectal cancer. Skin cancer. Lung cancer. What should I know about heart disease, diabetes, and high blood pressure? Blood pressure and heart disease High blood pressure causes heart disease and increases the risk of stroke. This is more likely to develop in people who have high blood pressure readings or are overweight. Have your blood pressure checked: Every 3-5 years if you are 80-17 years of age. Every year if you are 96 years old or older. Diabetes Have regular diabetes screenings. This checks your fasting blood sugar level. Have the screening done: Once every three years after age 45 if you are at a normal weight and have a low risk for diabetes. More often and at a younger age if you are overweight or have a high risk for diabetes. What should I know about preventing infection? Hepatitis B If you have a higher risk for hepatitis B, you should be screened for this virus. Talk with your health care provider to find out if you are at risk for hepatitis B infection.  Hepatitis C Testing is recommended for: Everyone born from [birth year] through 1965. Anyone with known risk factors for hepatitis C. Sexually transmitted infections (STIs) Get screened for STIs, including gonorrhea and chlamydia, if: You are sexually active and are younger than 44 years of age. You are older than 44 years of age and your  health care provider tells you that you are at risk for this type of infection. Your sexual activity has changed since you were last screened, and you are at increased risk for chlamydia or gonorrhea. Ask your health care provider if you are at risk. Ask your health care provider about whether you are at high risk for HIV. Your health care provider may recommend a prescription medicine to help prevent HIV infection. If you choose to take medicine to prevent HIV, you should first get tested for HIV. You should then be tested every 3 months for as long as you are taking the medicine. Pregnancy If you are about to stop having your period (premenopausal) and you may become pregnant, seek counseling before you get pregnant. Take 400 to 800 micrograms (mcg) of folic acid every day if you become pregnant. Ask for birth control (contraception) if you want to prevent pregnancy. Osteoporosis and menopause Osteoporosis is a disease in which the bones lose minerals and strength with aging. This can result in bone fractures. If you are 59 years old or older, or if you are at risk for osteoporosis and fractures, ask your health care provider if you should: Be screened for bone loss. Take a calcium or vitamin D supplement to lower your risk of fractures. Be given hormone replacement therapy (HRT) to treat symptoms of menopause. Follow these instructions at home: Alcohol use Do not drink alcohol if: Your health care provider tells you not to drink. You are pregnant, may be pregnant, or are planning to become pregnant. If you drink alcohol: Limit how much you have to: 0-1 drink a day. Know how much alcohol is in your drink. In the [place] one drink equals one 12 oz bottle of beer (355 mL), one 5 oz glass of wine (148 mL), or one 1 oz glass of hard liquor (44 mL). Lifestyle Do not use any products that contain nicotine or tobacco. These products include cigarettes, chewing tobacco, and vaping devices, such as  e-cigarettes. If you need help quitting, ask your health care provider. Do not use street drugs. Do not share needles. Ask your health care provider for help if you need support or information about quitting drugs. General instructions Schedule regular health, dental, and eye exams. Stay current with your vaccines. Tell your health care provider if: You often feel depressed. You have ever been abused or do not feel safe at home. Summary Adopting a healthy lifestyle and getting preventive care are important in promoting health and wellness. Follow your health care provider's instructions about healthy diet, exercising, and getting tested or screened for diseases. Follow your health care provider's instructions on monitoring your cholesterol and blood pressure. This information is not intended to replace advice given to you by your health care provider. Make sure you discuss any questions you have with your health care provider. Document Revised: 01/04/2021 Document Reviewed: 01/04/2021 Elsevier Patient Education  2024 [employer].

## 2023-04-20 ENCOUNTER — Encounter: Payer: Self-pay | Admitting: Family Medicine

## 2023-04-20 DIAGNOSIS — Z0000 Encounter for general adult medical examination without abnormal findings: Secondary | ICD-10-CM | POA: Insufficient documentation

## 2023-04-20 LAB — HEPATITIS C ANTIBODY: Hepatitis C Ab: NONREACTIVE

## 2023-04-20 MED ORDER — ROSUVASTATIN CALCIUM 10 MG PO TABS
10.0000 mg | ORAL_TABLET | Freq: Every day | ORAL | 3 refills | Status: DC
Start: 2023-04-20 — End: 2023-07-05
  Filled 2023-04-20: qty 90, 90d supply, fill #0
  Filled 2023-06-15: qty 90, 90d supply, fill #1

## 2023-04-20 NOTE — Assessment & Plan Note (Signed)
Continue nonpharmacologic treatment. Further recommendation will be given according to 10-year CVD score and lipid panel numbers.

## 2023-04-20 NOTE — Assessment & Plan Note (Signed)
We discussed the importance of regular physical activity and healthy diet for prevention of chronic illness and/or complications. Preventive guidelines reviewed. Vaccination up-to-date. Continue her female preventive care with her gynecologist. Next CPE in a year.

## 2023-04-20 NOTE — Assessment & Plan Note (Signed)
Problem is getting worse, Ambien 5 mg is not longer effective. We discussed other pharmacologic treatments. She would like trying Ambien CR 6.25 mg, 1-2 tabs at bedtime. Reviewed some side effects. Good sleep hygiene. F/U in 6-12 months.

## 2023-04-20 NOTE — Assessment & Plan Note (Signed)
BP adequately controlled. Continue amlodipine 10 mg daily and low-salt diet. Continue monitoring BP regularly. F/U in a year.

## 2023-04-21 ENCOUNTER — Other Ambulatory Visit (HOSPITAL_COMMUNITY): Payer: Self-pay

## 2023-04-26 ENCOUNTER — Encounter: Payer: Self-pay | Admitting: Internal Medicine

## 2023-04-26 ENCOUNTER — Ambulatory Visit (AMBULATORY_SURGERY_CENTER): Payer: 59 | Admitting: Internal Medicine

## 2023-04-26 VITALS — BP 110/79 | HR 74 | Temp 97.7°F | Resp 12 | Ht 68.0 in | Wt 150.0 lb

## 2023-04-26 DIAGNOSIS — E785 Hyperlipidemia, unspecified: Secondary | ICD-10-CM | POA: Diagnosis not present

## 2023-04-26 DIAGNOSIS — D123 Benign neoplasm of transverse colon: Secondary | ICD-10-CM | POA: Diagnosis not present

## 2023-04-26 DIAGNOSIS — Z1211 Encounter for screening for malignant neoplasm of colon: Secondary | ICD-10-CM

## 2023-04-26 DIAGNOSIS — K635 Polyp of colon: Secondary | ICD-10-CM | POA: Diagnosis not present

## 2023-04-26 DIAGNOSIS — I10 Essential (primary) hypertension: Secondary | ICD-10-CM | POA: Diagnosis not present

## 2023-04-26 MED ORDER — SODIUM CHLORIDE 0.9 % IV SOLN: 500.0000 mL | Freq: Once | INTRAVENOUS | Status: DC

## 2023-04-26 NOTE — Progress Notes (Signed)
Pt's states no medical or surgical changes since previsit or office visit. 

## 2023-04-26 NOTE — Op Note (Signed)
[facility] Patient Name: Desiree Campbell Procedure Date: 04/26/2023 11:01 AM MRN: [id number] Endoscopist: Madelyn Brunner [place] , , [id number] Age: 42 Referring MD:  Date of Birth: 05/28/1981 Gender: Female Account #: [id number] Procedure:                Colonoscopy Indications:              Lynch Syndrome Medicines:                Monitored Anesthesia Care Procedure:                Pre-Anesthesia Assessment:                           - Prior to the procedure, a History and Physical                            was performed, and patient medications and                            allergies were reviewed. The patient's tolerance of                            previous anesthesia was also reviewed. The risks                            and benefits of the procedure and the sedation                            options and risks were discussed with the patient.                            All questions were answered, and informed consent                            was obtained. Prior Anticoagulants: The patient has                            taken no anticoagulant or antiplatelet agents. ASA                            Grade Assessment: II - A patient with mild systemic                            disease. After reviewing the risks and benefits,                            the patient was deemed in satisfactory condition to                            undergo the procedure.                           After obtaining informed consent, the colonoscope  was passed under direct vision. Throughout the                            procedure, the patient's blood pressure, pulse, and                            oxygen saturations were monitored continuously. The                            Olympus PCF-[id number] (#[id number]) Colonoscope was                            introduced through the anus and advanced to the the                            terminal ileum. The colonoscopy was  performed                            without difficulty. The patient tolerated the                            procedure well. The quality of the bowel                            preparation was good. The terminal ileum, ileocecal                            valve, appendiceal orifice, and rectum were                            photographed. Scope In: 11:08:00 AM Scope Out: 11:27:47 AM Scope Withdrawal Time: 0 hours 11 minutes 12 seconds  Total Procedure Duration: 0 hours 19 minutes 47 seconds  Findings:                 The terminal ileum appeared normal.                           A single diverticulum was found in the ascending                            colon.                           Two sessile polyps were found in the transverse                            colon. The polyps were 3 to 5 mm in size. These                            polyps were removed with a cold snare. Resection                            and retrieval were complete.  Non-bleeding internal hemorrhoids were found during                            retroflexion. Complications:            No immediate complications. Estimated Blood Loss:     Estimated blood loss was minimal. Impression:               - The examined portion of the ileum was normal.                           - Diverticulosis in the ascending colon.                           - Two 3 to 5 mm polyps in the transverse colon,                            removed with a cold snare. Resected and retrieved.                           - Non-bleeding internal hemorrhoids. Recommendation:           - Discharge patient to home (with escort).                           - Await pathology results.                           - Return to GI clinic in 2-3 months for                            consideration of EGD in the future.                           - The findings and recommendations were discussed                            with the patient. Dr  Particia Lather "[place]" [place],  04/26/2023 11:34:18 AM

## 2023-04-26 NOTE — Progress Notes (Signed)
Called to room to assist during endoscopic procedure.  Patient ID and intended procedure confirmed with present staff. Received instructions for my participation in the procedure from the performing physician.  

## 2023-04-26 NOTE — Progress Notes (Signed)
GASTROENTEROLOGY PROCEDURE H&P NOTE   Primary Care Physician: [place], Betty G, MD    Reason for Procedure:   PMS2 Lynch syndrome  Plan:    Colonoscopy  Patient is appropriate for endoscopic procedure(s) in the ambulatory ([facility]) setting.  The nature of the procedure, as well as the risks, benefits, and alternatives were carefully and thoroughly reviewed with the patient. Ample time for discussion and questions allowed. The patient understood, was satisfied, and agreed to proceed.     HPI: Desiree Campbell is a 42 y.o. female who presents for colonoscopy for PMS2 Lynch syndrome.  Past Medical History:  Diagnosis Date   Anxiety    hx of   Hyperlipidemia    diet controlled   Hypertension    on meds   Lynch syndrome    Positive TB test    cleared by cxr per patient    Past Surgical History:  Procedure Laterality Date   EYE SURGERY  1983   WISDOM TOOTH EXTRACTION  2015    Prior to Admission medications   Medication Sig Start Date End Date Taking? Authorizing Provider  amLODipine (NORVASC) 10 MG tablet Take 1 tablet (10 mg total) by mouth daily. 11/02/22  Yes [place], Betty G, MD  propranolol (INDERAL) 20 MG tablet Take 1 tablet (20 mg total) by mouth daily as needed. 03/22/21  Yes [place], Betty G, MD  rosuvastatin (CRESTOR) 10 MG tablet Take 1 tablet (10 mg total) by mouth daily. 04/20/23  Yes [place], Betty G, MD  minoxidil (LONITEN) 2.5 MG tablet Take 1/2 tablet (1.25 mg total) by mouth daily. Patient not taking: Reported on 04/26/2023 04/19/23   [place], Betty G, MD  zolpidem (AMBIEN CR) 6.25 MG CR tablet Take 1 tablet (6.25 mg total) by mouth at bedtime as needed for sleep. 04/19/23   [place], Betty G, MD    Current Outpatient Medications  Medication Sig Dispense Refill   amLODipine (NORVASC) 10 MG tablet Take 1 tablet (10 mg total) by mouth daily. 90 tablet 3   propranolol (INDERAL) 20 MG tablet Take 1 tablet (20 mg total) by mouth daily as needed. 90 tablet 3    rosuvastatin (CRESTOR) 10 MG tablet Take 1 tablet (10 mg total) by mouth daily. 90 tablet 3   minoxidil (LONITEN) 2.5 MG tablet Take 1/2 tablet (1.25 mg total) by mouth daily. (Patient not taking: Reported on 04/26/2023) 30 tablet 1   zolpidem (AMBIEN CR) 6.25 MG CR tablet Take 1 tablet (6.25 mg total) by mouth at bedtime as needed for sleep. 30 tablet 0   Current Facility-Administered Medications  Medication Dose Route Frequency Provider Last Rate Last Admin   0.9 %  sodium chloride infusion  500 mL Intravenous Once Imogene Burn, MD        Allergies as of 04/26/2023   (No Known Allergies)    Family History  Problem Relation Age of Onset   Diabetes Mother    Hyperlipidemia Mother    Miscarriages / [place] Mother    Luiz Blare' disease Mother    Colon polyps Father 36   Hyperlipidemia Father    Arthritis Father    Hyperlipidemia Sister    Breast cancer Paternal Aunt 19 - 59   Alcohol abuse Maternal Grandmother    Arthritis Paternal Grandmother    Breast cancer Paternal Grandmother 70 - 69   Uterine cancer Cousin        paternal half-cousin   Ovarian cancer Other 50 - 69  maternal half-aunt   Colon cancer Neg Hx    Rectal cancer Neg Hx    Stomach cancer Neg Hx     Social History   Socioeconomic History   Marital status: Married    Spouse name: Not on file   Number of children: 2   Years of education: Not on file   Highest education level: Professional school degree (e.g., MD, DDS, DVM, JD)  Occupational History   Occupation: Pediatrician  Tobacco Use   Smoking status: Never   Smokeless tobacco: Never  Vaping Use   Vaping status: Never Used  Substance and Sexual Activity   Alcohol use: Not Currently   Drug use: No   Sexual activity: Not on file  Other Topics Concern   Not on file  Social History Narrative   Not on file   Social Determinants of Health   Financial Resource Strain: Low Risk  (07/28/2021)   Overall Financial Resource Strain (CARDIA)     Difficulty of Paying Living Expenses: Not hard at all  Food Insecurity: No Food Insecurity (07/28/2021)   Hunger Vital Sign    Worried About Running Out of Food in the Last Year: Never true    Ran Out of Food in the Last Year: Never true  Transportation Needs: No Transportation Needs (07/28/2021)   PRAPARE - Administrator, Civil Service (Medical): No    Lack of Transportation (Non-Medical): No  Physical Activity: Sufficiently Active (07/28/2021)   Exercise Vital Sign    Days of Exercise per Week: 3 days    Minutes of Exercise per Session: 50 min  Stress: No Stress Concern Present (07/28/2021)   [employer] of Occupational Health - Occupational Stress Questionnaire    Feeling of Stress : Not at all  Social Connections: Moderately Integrated (07/28/2021)   Social Connection and Isolation Panel [NHANES]    Frequency of Communication with Friends and Family: Three times a week    Frequency of Social Gatherings with Friends and Family: Twice a week    Attends Religious Services: Never    Database administrator or Organizations: Yes    Attends Engineer, structural: More than 4 times per year    Marital Status: Married  Catering manager Violence: Not on file    Physical Exam: Vital signs in last 24 hours: BP (!) 141/82   Pulse 75   Temp 97.7 F (36.5 C)   Ht 5\' 8"  (1.727 m)   Wt 150 lb (68 kg)   SpO2 100%   BMI 22.81 kg/m  GEN: NAD EYE: Sclerae anicteric ENT: MMM CV: Non-tachycardic Pulm: No increased work of breathing GI: Soft, NT/ND NEURO:  Alert & Oriented   Eulah Pont, MD [facility]  04/26/2023 10:51 AM

## 2023-04-26 NOTE — Progress Notes (Signed)
Sedate, gd SR, tolerated procedure well, VSS, report to RN 

## 2023-04-26 NOTE — Patient Instructions (Signed)
Please read handouts provided. Await pathology results. Return to GI clinic in 2-3 months for consideration of EGD in the future.   YOU HAD AN ENDOSCOPIC PROCEDURE TODAY AT THE [facility]:   Refer to the procedure report that was given to you for any specific questions about what was found during the examination.  If the procedure report does not answer your questions, please call your gastroenterologist to clarify.  If you requested that your care partner not be given the details of your procedure findings, then the procedure report has been included in a sealed envelope for you to review at your convenience later.  YOU SHOULD EXPECT: Some feelings of bloating in the abdomen. Passage of more gas than usual.  Walking can help get rid of the air that was put into your GI tract during the procedure and reduce the bloating. If you had a lower endoscopy (such as a colonoscopy or flexible sigmoidoscopy) you may notice spotting of blood in your stool or on the toilet paper. If you underwent a bowel prep for your procedure, you may not have a normal bowel movement for a few days.  Please Note:  You might notice some irritation and congestion in your nose or some drainage.  This is from the oxygen used during your procedure.  There is no need for concern and it should clear up in a day or so.  SYMPTOMS TO REPORT IMMEDIATELY:  Following lower endoscopy (colonoscopy or flexible sigmoidoscopy):  Excessive amounts of blood in the stool  Significant tenderness or worsening of abdominal pains  Swelling of the abdomen that is new, acute  Fever of 100F or higher  For urgent or emergent issues, a gastroenterologist can be reached at any hour by calling [phone]. Do not use MyChart messaging for urgent concerns.    DIET:  We do recommend a small meal at first, but then you may proceed to your regular diet.  Drink plenty of fluids but you should avoid alcoholic beverages for 24  hours.  ACTIVITY:  You should plan to take it easy for the rest of today and you should NOT DRIVE or use heavy machinery until tomorrow (because of the sedation medicines used during the test).    FOLLOW UP: Our staff will call the number listed on your records the next business day following your procedure.  We will call around 7:15- 8:00 am to check on you and address any questions or concerns that you may have regarding the information given to you following your procedure. If we do not reach you, we will leave a message.     If any biopsies were taken you will be contacted by phone or by letter within the next 1-3 weeks.  Please call us at [phone] if you have not heard about the biopsies in 3 weeks.    SIGNATURES/CONFIDENTIALITY: You and/or your care partner have signed paperwork which will be entered into your electronic medical record.  These signatures attest to the fact that that the information above on your After Visit Summary has been reviewed and is understood.  Full responsibility of the confidentiality of this discharge information lies with you and/or your care-partner.

## 2023-04-27 ENCOUNTER — Telehealth: Payer: Self-pay

## 2023-04-27 NOTE — Telephone Encounter (Signed)
Follow up call placed, VM obtained and message left. 

## 2023-05-05 ENCOUNTER — Encounter: Payer: Self-pay | Admitting: Internal Medicine

## 2023-06-07 ENCOUNTER — Ambulatory Visit (INDEPENDENT_AMBULATORY_CARE_PROVIDER_SITE_OTHER): Payer: 59

## 2023-06-07 DIAGNOSIS — Z23 Encounter for immunization: Secondary | ICD-10-CM | POA: Diagnosis not present

## 2023-06-07 DIAGNOSIS — H52222 Regular astigmatism, left eye: Secondary | ICD-10-CM | POA: Diagnosis not present

## 2023-06-15 ENCOUNTER — Other Ambulatory Visit: Payer: Self-pay

## 2023-06-15 ENCOUNTER — Other Ambulatory Visit (HOSPITAL_COMMUNITY): Payer: Self-pay

## 2023-06-30 NOTE — Progress Notes (Signed)
HPI: Desiree Campbell is a 42 y.o. female with a PMHx significant for HTN, HLD, and insomnia, who is here today for chronic disease management.  Last seen on 04/19/2023  Hyperlipidemia: Currently on rosuvastatin 10 mg daily. Diet: She states she eats well.  Side effects from medication: none. She is interested in trying combination tab with antihypertensive and stain. She is on Amlodipine 10 mg daily.  -She is also on Propranolol 20 mg daily prn for situational anxiety.  Lab Results  Component Value Date   CHOL 282 (H) 04/19/2023   HDL 71.00 04/19/2023   LDLCALC 195 (H) 04/19/2023   TRIG 82.0 04/19/2023   CHOLHDL 4 04/19/2023   Insomnia: She is currently on Ambien 6.25 mg prn, she takes medication occasionally. In the past we have  discussed other options like Trazodone, she would like to try. She says she is sleeping 6-7 hours with the Ambien.  It has been well tolerated.  Hair thinning: Last visit Minoxidil 2.5 mg 1/2 tab was started. She has not noted significant improvement, has tolerated medication well, no side effects.  Review of Systems  Constitutional:  Negative for activity change, appetite change, chills and fever.  HENT:  Negative for mouth sores and sore throat.   Respiratory:  Negative for cough, shortness of breath and wheezing.   Cardiovascular:  Negative for chest pain, palpitations and leg swelling.  Gastrointestinal:  Negative for abdominal pain, nausea and vomiting.  Genitourinary:  Negative for decreased urine volume, dysuria and hematuria.  Neurological:  Negative for syncope, weakness and headaches.  Psychiatric/Behavioral:  Negative for confusion and hallucinations.   See other pertinent positives and negatives in HPI.  Current Outpatient Medications on File Prior to Visit  Medication Sig Dispense Refill   zolpidem (AMBIEN CR) 6.25 MG CR tablet Take 1 tablet (6.25 mg total) by mouth at bedtime as needed for sleep. 30 tablet 0   No  current facility-administered medications on file prior to visit.   Past Medical History:  Diagnosis Date   Anxiety    hx of   Hyperlipidemia    diet controlled   Hypertension    on meds   Lynch syndrome    Positive TB test    cleared by cxr per patient   No Known Allergies  Social History   Socioeconomic History   Marital status: Married    Spouse name: Not on file   Number of children: 2   Years of education: Not on file   Highest education level: Professional school degree (e.g., MD, DDS, DVM, JD)  Occupational History   Occupation: Pediatrician  Tobacco Use   Smoking status: Never   Smokeless tobacco: Never  Vaping Use   Vaping status: Never Used  Substance and Sexual Activity   Alcohol use: Not Currently   Drug use: No   Sexual activity: Not on file  Other Topics Concern   Not on file  Social History Narrative   Not on file   Social Determinants of Health   Financial Resource Strain: Low Risk  (07/28/2021)   Overall Financial Resource Strain (CARDIA)    Difficulty of Paying Living Expenses: Not hard at all  Food Insecurity: No Food Insecurity (07/28/2021)   Hunger Vital Sign    Worried About Running Out of Food in the Last Year: Never true    Ran Out of Food in the Last Year: Never true  Transportation Needs: No Transportation Needs (07/28/2021)   PRAPARE - Transportation  Lack of Transportation (Medical): No    Lack of Transportation (Non-Medical): No  Physical Activity: Sufficiently Active (07/28/2021)   Exercise Vital Sign    Days of Exercise per Week: 3 days    Minutes of Exercise per Session: 50 min  Stress: No Stress Concern Present (07/28/2021)   [employer] of Occupational Health - Occupational Stress Questionnaire    Feeling of Stress : Not at all  Social Connections: Moderately Integrated (07/28/2021)   Social Connection and Isolation Panel [NHANES]    Frequency of Communication with Friends and Family: Three times a week     Frequency of Social Gatherings with Friends and Family: Twice a week    Attends Religious Services: Never    Database administrator or Organizations: Yes    Attends Engineer, structural: More than 4 times per year    Marital Status: Married    Vitals:   07/05/23 0753  BP: 118/80  Pulse: 75  Resp: 12  Temp: 98.8 F (37.1 C)  SpO2: 99%   Body mass index is 22.43 kg/m.  Physical Exam Vitals and nursing note reviewed.  Constitutional:      General: She is not in acute distress.    Appearance: She is well-developed.  HENT:     Head: Normocephalic and atraumatic.  Eyes:     Conjunctiva/sclera: Conjunctivae normal.  Cardiovascular:     Rate and Rhythm: Normal rate and regular rhythm.     Pulses:          Posterior tibial pulses are 2+ on the right side and 2+ on the left side.     Heart sounds: No murmur heard. Pulmonary:     Effort: Pulmonary effort is normal. No respiratory distress.     Breath sounds: Normal breath sounds.  Abdominal:     Palpations: Abdomen is soft. There is no mass.     Tenderness: There is no abdominal tenderness.  Musculoskeletal:     Right lower leg: No edema.     Left lower leg: No edema.  Skin:    General: Skin is warm.     Findings: No erythema or rash.  Neurological:     General: No focal deficit present.     Mental Status: She is alert and oriented to person, place, and time.     Cranial Nerves: No cranial nerve deficit.     Gait: Gait normal.  Psychiatric:        Mood and Affect: Mood and affect normal.   ASSESSMENT AND PLAN:  Desiree Campbell was seen today for chronic disease management.   Orders Placed This Encounter  Procedures   Lipid panel   Hepatic function panel   Lab Results  Component Value Date   ALT 13 07/05/2023   AST 18 07/05/2023   ALKPHOS 47 07/05/2023   BILITOT 0.6 07/05/2023   Lab Results  Component Value Date   CHOL 166 07/05/2023   HDL 72.10 07/05/2023   LDLCALC 85 07/05/2023   TRIG 45.0  07/05/2023   CHOLHDL 2 07/05/2023   Insomnia, unspecified type Assessment & Plan: Problem is stable. She is currently on Ambien CR 6.25 mg at bedtime as needed but would like to try Trazodone. Trazodone 50 mg 0.5-1 tab 30-45 min before bedtime as needed. Some side effects discussed. Continue good sleep hygiene. She will let me know if Trazodone helps, in which case Ambien can be discontinued.  Orders: -     traZODone HCl; Take 0.5-1 tablets (25-50  mg total) by mouth at bedtime as needed for sleep.  Dispense: 30 tablet; Refill: 1  Hair thinning Assessment & Plan: She has not noted significant improvement, has tolerated med well. Continue Minoxidil 2.5 mg 0.5 tab daily for a few more months, she will let me know if it helps, if not medication can be discontinued. Some side effects discussed.  Orders: -     Minoxidil; Take 1/2 tablet (1.25 mg total) by mouth daily.  Dispense: 90 tablet; Refill: 0  Situational anxiety Assessment & Plan: Stable. Continue Propranolol 20 mg daily prn. Could consider taking Trazodone daily to help with this problems as well.  Orders: -     Propranolol HCl; Take 1 tablet (20 mg total) by mouth daily as needed.  Dispense: 90 tablet; Refill: 3  Hyperlipidemia, unspecified hyperlipidemia type Assessment & Plan: She has tolerated Rosuvastatin well, would like a combination tab. So stop Rosuvastatin,  Amlodipine-Atorvastatin 10-20 mg daily can be tried. Continue low fat diet. Further recommendations according to lipid panel result.  Orders: -     Lipid panel; Future -     Hepatic function panel; Future  Other orders -     amLODIPine-Atorvastatin; Take 1 tablet by mouth daily.  Dispense: 90 tablet; Refill: 1   Return for 03/2024.  I, Rolla Etienne Wierda, acting as a scribe for Addalyne Vandehei [place], MD., have documented all relevant documentation on the behalf of Xachary Hambly [place], MD, as directed by  Solly Derasmo [place], MD while in the presence of Kylyn Mcdade [place], MD.   I,  Kaz Auld [place], MD, have reviewed all documentation for this visit. The documentation on 07/05/23 for the exam, diagnosis, procedures, and orders are all accurate and complete.  Varnell Donate G. [place], MD  [facility]. [place] office.

## 2023-07-05 ENCOUNTER — Other Ambulatory Visit: Payer: Self-pay | Admitting: Family Medicine

## 2023-07-05 ENCOUNTER — Other Ambulatory Visit (HOSPITAL_COMMUNITY): Payer: Self-pay

## 2023-07-05 ENCOUNTER — Ambulatory Visit: Payer: 59 | Admitting: Family Medicine

## 2023-07-05 ENCOUNTER — Encounter: Payer: Self-pay | Admitting: Family Medicine

## 2023-07-05 ENCOUNTER — Other Ambulatory Visit: Payer: Self-pay

## 2023-07-05 VITALS — BP 118/80 | HR 75 | Temp 98.8°F | Resp 12 | Ht 68.0 in | Wt 147.5 lb

## 2023-07-05 DIAGNOSIS — E785 Hyperlipidemia, unspecified: Secondary | ICD-10-CM

## 2023-07-05 DIAGNOSIS — L659 Nonscarring hair loss, unspecified: Secondary | ICD-10-CM

## 2023-07-05 DIAGNOSIS — R778 Other specified abnormalities of plasma proteins: Secondary | ICD-10-CM

## 2023-07-05 DIAGNOSIS — F418 Other specified anxiety disorders: Secondary | ICD-10-CM

## 2023-07-05 DIAGNOSIS — G4700 Insomnia, unspecified: Secondary | ICD-10-CM

## 2023-07-05 LAB — LIPID PANEL
Cholesterol: 166 mg/dL (ref 0–200)
HDL: 72.1 mg/dL (ref >39.00)
LDL Cholesterol: 85 mg/dL (ref 0–99)
NonHDL: 93.71
Total CHOL/HDL Ratio: 2
Triglycerides: 45 mg/dL (ref 0.0–149.0)
VLDL: 9 mg/dL (ref 0.0–40.0)

## 2023-07-05 LAB — HEPATIC FUNCTION PANEL
ALT: 13 U/L (ref 0–35)
AST: 18 U/L (ref 0–37)
Albumin: 4.6 g/dL (ref 3.5–5.2)
Alkaline Phosphatase: 47 U/L (ref 39–117)
Bilirubin, Direct: 0.1 mg/dL (ref 0.0–0.3)
Total Bilirubin: 0.6 mg/dL (ref 0.2–1.2)
Total Protein: 8.4 g/dL — ABNORMAL HIGH (ref 6.0–8.3)

## 2023-07-05 MED ORDER — MINOXIDIL 2.5 MG PO TABS
1.2500 mg | ORAL_TABLET | Freq: Every day | ORAL | 0 refills | Status: DC
Start: 2023-07-05 — End: 2024-02-02
  Filled 2023-07-05: qty 90, 180d supply, fill #0
  Filled 2023-08-15: qty 45, 90d supply, fill #0
  Filled 2023-10-18 – 2023-11-16 (×2): qty 45, 90d supply, fill #1

## 2023-07-05 MED ORDER — TRAZODONE HCL 50 MG PO TABS
25.0000 mg | ORAL_TABLET | Freq: Every evening | ORAL | 1 refills | Status: AC | PRN
Start: 2023-07-05 — End: ?
  Filled 2023-07-05: qty 30, 30d supply, fill #0
  Filled 2024-03-22: qty 30, 30d supply, fill #1

## 2023-07-05 MED ORDER — AMLODIPINE-ATORVASTATIN 10-20 MG PO TABS
1.0000 | ORAL_TABLET | Freq: Every day | ORAL | 1 refills | Status: DC
Start: 2023-07-05 — End: 2023-07-11
  Filled 2023-07-05: qty 90, 90d supply, fill #0

## 2023-07-05 MED ORDER — PROPRANOLOL HCL 20 MG PO TABS
20.0000 mg | ORAL_TABLET | Freq: Every day | ORAL | 3 refills | Status: AC | PRN
Start: 2023-07-05 — End: ?
  Filled 2023-07-05: qty 90, 90d supply, fill #0

## 2023-07-05 NOTE — Patient Instructions (Addendum)
A few things to remember from today's visit:  Insomnia, unspecified type  Hair thinning - Plan: minoxidil (LONITEN) 2.5 MG tablet  Situational anxiety - Plan: propranolol (INDERAL) 20 MG tablet  Hyperlipidemia, unspecified hyperlipidemia type - Plan: Lipid panel, Hepatic function panel Trazodone 25-50 mg 3--45 min before bedtime added today. Rosuvastatin replaced for Atorvastatin and combination tab with amlodipine started today. Let mw know if Trazodone helps with sleep.  If you need refills for medications you take chronically, please call your pharmacy. Do not use My Chart to request refills or for acute issues that need immediate attention. If you send a my chart message, it may take a few days to be addressed, specially if I am not in the office.  Please be sure medication list is accurate. If a new problem present, please set up appointment sooner than planned today.

## 2023-07-06 NOTE — Assessment & Plan Note (Signed)
Stable. Continue Propranolol 20 mg daily prn. Could consider taking Trazodone daily to help with this problems as well.

## 2023-07-06 NOTE — Assessment & Plan Note (Signed)
She has tolerated Rosuvastatin well, would like a combination tab. So stop Rosuvastatin,  Amlodipine-Atorvastatin 10-20 mg daily can be tried. Continue low fat diet. Further recommendations according to lipid panel result.

## 2023-07-06 NOTE — Assessment & Plan Note (Signed)
Problem is stable. She is currently on Ambien CR 6.25 mg at bedtime as needed but would like to try Trazodone. Trazodone 50 mg 0.5-1 tab 30-45 min before bedtime as needed. Some side effects discussed. Continue good sleep hygiene. She will let me know if Trazodone helps, in which case Ambien can be discontinued.

## 2023-07-06 NOTE — Assessment & Plan Note (Addendum)
She has not noted significant improvement, has tolerated med well. Continue Minoxidil 2.5 mg 0.5 tab daily for a few more months, she will let me know if it helps, if not medication can be discontinued. Some side effects discussed.

## 2023-07-07 ENCOUNTER — Other Ambulatory Visit (HOSPITAL_COMMUNITY): Payer: Self-pay

## 2023-07-07 ENCOUNTER — Encounter: Payer: Self-pay | Admitting: Family Medicine

## 2023-07-11 ENCOUNTER — Other Ambulatory Visit: Payer: Self-pay | Admitting: Family Medicine

## 2023-07-11 ENCOUNTER — Other Ambulatory Visit (HOSPITAL_COMMUNITY): Payer: Self-pay

## 2023-07-11 DIAGNOSIS — I10 Essential (primary) hypertension: Secondary | ICD-10-CM

## 2023-07-11 DIAGNOSIS — E785 Hyperlipidemia, unspecified: Secondary | ICD-10-CM

## 2023-07-11 MED ORDER — AMLODIPINE BESYLATE 10 MG PO TABS
10.0000 mg | ORAL_TABLET | Freq: Every day | ORAL | 2 refills | Status: DC
  Filled 2023-07-11 – 2023-10-18 (×2): qty 90, 90d supply, fill #0
  Filled 2024-01-17: qty 90, 90d supply, fill #1
  Filled 2024-04-17: qty 90, 90d supply, fill #2

## 2023-07-11 MED ORDER — ROSUVASTATIN CALCIUM 10 MG PO TABS
10.0000 mg | ORAL_TABLET | Freq: Every day | ORAL | 3 refills | Status: DC
  Filled 2023-07-11 – 2023-07-25 (×2): qty 90, 90d supply, fill #0
  Filled 2023-10-18: qty 90, 90d supply, fill #1
  Filled 2024-01-17: qty 90, 90d supply, fill #2
  Filled 2024-04-17: qty 90, 90d supply, fill #3

## 2023-07-12 ENCOUNTER — Other Ambulatory Visit (HOSPITAL_COMMUNITY): Payer: Self-pay

## 2023-07-12 ENCOUNTER — Encounter (HOSPITAL_COMMUNITY): Payer: Self-pay

## 2023-07-25 ENCOUNTER — Other Ambulatory Visit (HOSPITAL_COMMUNITY): Payer: Self-pay

## 2023-08-07 ENCOUNTER — Encounter: Payer: Self-pay | Admitting: Internal Medicine

## 2023-08-07 ENCOUNTER — Ambulatory Visit: Payer: 59 | Admitting: Internal Medicine

## 2023-08-07 VITALS — BP 122/80 | HR 73 | Ht 68.0 in | Wt 155.2 lb

## 2023-08-07 DIAGNOSIS — Z1509 Genetic susceptibility to other malignant neoplasm: Secondary | ICD-10-CM

## 2023-08-07 DIAGNOSIS — K5900 Constipation, unspecified: Secondary | ICD-10-CM

## 2023-08-07 NOTE — Patient Instructions (Addendum)
You have been scheduled for an endoscopy. Please follow written instructions given to you at your visit today.  If you use inhalers (even only as needed), please bring them with you on the day of your procedure.  If you take any of the following medications, they will need to be adjusted prior to your procedure:   DO NOT TAKE 7 DAYS PRIOR TO TEST- Trulicity (dulaglutide) Ozempic, Wegovy (semaglutide) Mounjaro (tirzepatide) Bydureon Bcise (exanatide extended release)  DO NOT TAKE 1 DAY PRIOR TO YOUR TEST Rybelsus (semaglutide) Adlyxin (lixisenatide) Victoza (liraglutide) Byetta (exanatide)  If your blood pressure at your visit was 140/90 or greater, please contact your primary care physician to follow up on this.  _______________________________________________________  If you are age 71 or older, your body mass index should be between 23-30. Your Body mass index is 23.61 kg/m. If this is out of the aforementioned range listed, please consider follow up with your Primary Care Provider.  If you are age 77 or younger, your body mass index should be between 19-25. Your Body mass index is 23.61 kg/m. If this is out of the aformentioned range listed, please consider follow up with your Primary Care Provider.   ________________________________________________________  The [facility] providers would like to encourage you to use [facility] to communicate with providers for non-urgent requests or questions.  Due to long hold times on the telephone, sending your provider a message by [facility] may be a faster and more efficient way to get a response.  Please allow 48 business hours for a response.  Please remember that this is for non-urgent requests.   Due to recent changes in healthcare laws, you may see the results of your imaging and laboratory studies on MyChart before your provider has had a chance to review them.  We understand that in some cases there may be results that are confusing or  concerning to you. Not all laboratory results come back in the same time frame and the provider may be waiting for multiple results in order to interpret others.  Please give [place] 48 hours in order for your provider to thoroughly review all the results before contacting the office for clarification of your results.    Thank you for entrusting me with your care and for choosing [facility],  Dr. Eulah Pont

## 2023-08-07 NOTE — Progress Notes (Signed)
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   Constipation Patient is interested in getting an EGD to screen for gastric and small bowel cancer.  Will plan to perform gastric biopsies during her EGD to rule out H. pylori.  Her last colonoscopy showed no precancerous polyps so she would be due for another colonoscopy in 03/2026.  Patient does describe issues with constipation but this is not excessively bothersome to her at this time.  PLAN: - EGD [facility].  Will plan to take gastric biopsies rule out H. 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  She has mild anxiety, hypercholesterolemia, and hypertension.   Patient works as a pediatrician  Colonoscopy 04/26/23: - The examined portion of the ileum was normal. - Diverticulosis in the ascending colon. - Two 3 to 5 mm polyps in the transverse colon, removed with a cold snare. Resected and retrieved. - Non- bleeding internal hemorrhoids. Path: Surgical [P], colon, transverse, polyp (2) - POLYPOID COLONIC MUCOSA WITH MILD HYPERPLASTIC CHANGES - NEGATIVE FOR DYSPLASIA - MULTIPLE STEP SECTIONS WERE EXAMINED  Interval History: Denies ab pain, dysphagia, nausea, vomiting, or acid reflux. She does think that she is more constipated. She will sometimes have one stool every 4-5 days. She takes Metamucil daily because she likes how it tastes. She uses Dulcolax and Miralax on occasion, which seems to be effective for helping with her constipation.  After she did the colonoscopy prep, she felt like she had significant improvement in her waistline.   Past Medical History:  Diagnosis Date   Anxiety    hx of   Hyperlipidemia    diet controlled   Hypertension    on meds   Lynch  syndrome    Positive TB test    cleared by cxr per patient    Past Surgical History:  Procedure Laterality Date   EYE SURGERY  1983   WISDOM TOOTH EXTRACTION  2015    Prior to Admission medications   Medication Sig Start Date End Date Taking? Authorizing Provider  amLODipine (NORVASC) 10 MG tablet Take 1 tablet (10 mg total) by mouth daily. 07/31/21  Yes [place], Betty G, MD  propranolol (INDERAL) 20 MG tablet Take 1 tablet (20 mg total) by mouth daily as needed. 03/22/21  Yes [place], Betty G, MD  zolpidem (AMBIEN) 5 MG tablet Take 0.5-1 tablets (2.5-5 mg total) by mouth at bedtime as needed for sleep. 03/04/22  Yes [place], Betty G, MD    Current Outpatient Medications  Medication Sig Dispense Refill   amLODipine (NORVASC) 10 MG tablet Take 1 tablet (10 mg total) by mouth daily. 90 tablet 2   minoxidil (LONITEN) 2.5 MG tablet Take 1/2 tablet (1.25 mg total) by mouth daily. 90 tablet 0   propranolol (INDERAL) 20 MG tablet Take 1 tablet (20 mg total) by mouth daily as needed. 90 tablet 3   rosuvastatin (CRESTOR) 10 MG tablet Take 1 tablet (10 mg total) by mouth daily. 90 tablet 3   traZODone (DESYREL) 50 MG tablet Take 0.5-1 tablets (25-50 mg total) by mouth at bedtime as needed for sleep. 30 tablet 1   zolpidem (AMBIEN CR) 6.25 MG CR tablet Take 1 tablet (6.25 mg total) by mouth at bedtime as needed for  sleep. 30 tablet 0   No current facility-administered medications for this visit.    Allergies as of 08/07/2023   (No Known Allergies)    Family History  Problem Relation Age of Onset   Diabetes Mother    Hyperlipidemia Mother    Miscarriages / Stillbirths Mother    Graves' disease Mother    Colon polyps Father 79   Hyperlipidemia Father    Arthritis Father    Hyperlipidemia Sister    Breast cancer Paternal Aunt 16 - 92   Alcohol abuse Maternal Grandmother    Arthritis Paternal Grandmother    Breast cancer Paternal Grandmother 74 - 76   Uterine cancer Cousin        paternal  half-cousin   Ovarian cancer Other 63 - 40       maternal half-aunt   Colon cancer Neg Hx    Rectal cancer Neg Hx    Stomach cancer Neg Hx     Social History   Socioeconomic History   Marital status: Married    Spouse name: Not on file   Number of children: 2   Years of education: Not on file   Highest education level: Professional school degree (e.g., MD, DDS, DVM, JD)  Occupational History   Occupation: Pediatrician  Tobacco Use   Smoking status: Never   Smokeless tobacco: Never  Vaping Use   Vaping status: Never Used  Substance and Sexual Activity   Alcohol use: Not Currently   Drug use: No   Sexual activity: Not on file  Other Topics Concern   Not on file  Social History Narrative   Not on file   Social Determinants of Health   Financial Resource Strain: Low Risk  (07/28/2021)   Overall Financial Resource Strain (CARDIA)    Difficulty of Paying Living Expenses: Not hard at all  Food Insecurity: No Food Insecurity (07/28/2021)   Hunger Vital Sign    Worried About Running Out of Food in the Last Year: Never true    Ran Out of Food in the Last Year: Never true  Transportation Needs: No Transportation Needs (07/28/2021)   PRAPARE - Administrator, Civil Service (Medical): No    Lack of Transportation (Non-Medical): No  Physical Activity: Sufficiently Active (07/28/2021)   Exercise Vital Sign    Days of Exercise per Week: 3 days    Minutes of Exercise per Session: 50 min  Stress: No Stress Concern Present (07/28/2021)   [employer] of Occupational Health - Occupational Stress Questionnaire    Feeling of Stress : Not at all  Social Connections: Moderately Integrated (07/28/2021)   Social Connection and Isolation Panel [NHANES]    Frequency of Communication with Friends and Family: Three times a week    Frequency of Social Gatherings with Friends and Family: Twice a week    Attends Religious Services: Never    Database administrator or  Organizations: Yes    Attends Engineer, structural: More than 4 times per year    Marital Status: Married  Catering manager Violence: Not on file   Physical Exam: General:   Alert,  well-nourished, pleasant and cooperative in NAD Head:  Normocephalic and atraumatic. Lungs:  Clear throughout to auscultation.   No wheezes. Heart:  Regular rate and rhythm; no murmurs. Abdomen:  Soft, nontender, nondistended, normal bowel sounds Msk:  Symmetrical. No boney deformities LAD: No inguinal or umbilical LAD Extremities:  No clubbing or edema. Neurologic:  Alert and  oriented  x4;  grossly nonfocal Skin:  Intact without significant lesions or rashes. Psych:  Alert and cooperative. Normal mood and affect.  Eulah Pont, MD 08/07/2023, 3:18 PM  I spent 32 minutes of time, including in depth chart review, independent review of results as outlined above, communicating results with the patient directly, face-to-face time with the patient, coordinating care, and ordering studies and medications as appropriate, and documentation.

## 2023-08-08 ENCOUNTER — Encounter: Payer: Self-pay | Admitting: Internal Medicine

## 2023-08-15 ENCOUNTER — Other Ambulatory Visit (HOSPITAL_COMMUNITY): Payer: Self-pay

## 2023-08-16 ENCOUNTER — Encounter: Payer: Self-pay | Admitting: Internal Medicine

## 2023-08-16 ENCOUNTER — Ambulatory Visit (AMBULATORY_SURGERY_CENTER): Payer: 59 | Admitting: Internal Medicine

## 2023-08-16 VITALS — BP 106/69 | HR 83 | Temp 98.6°F | Resp 10 | Ht 68.0 in | Wt 155.0 lb

## 2023-08-16 DIAGNOSIS — I10 Essential (primary) hypertension: Secondary | ICD-10-CM | POA: Diagnosis not present

## 2023-08-16 DIAGNOSIS — K2980 Duodenitis without bleeding: Secondary | ICD-10-CM | POA: Diagnosis not present

## 2023-08-16 DIAGNOSIS — K317 Polyp of stomach and duodenum: Secondary | ICD-10-CM | POA: Diagnosis not present

## 2023-08-16 DIAGNOSIS — Z1509 Genetic susceptibility to other malignant neoplasm: Secondary | ICD-10-CM

## 2023-08-16 DIAGNOSIS — K31A19 Gastric intestinal metaplasia without dysplasia, unspecified site: Secondary | ICD-10-CM

## 2023-08-16 DIAGNOSIS — K2950 Unspecified chronic gastritis without bleeding: Secondary | ICD-10-CM

## 2023-08-16 DIAGNOSIS — K3189 Other diseases of stomach and duodenum: Secondary | ICD-10-CM | POA: Diagnosis not present

## 2023-08-16 DIAGNOSIS — E785 Hyperlipidemia, unspecified: Secondary | ICD-10-CM | POA: Diagnosis not present

## 2023-08-16 MED ORDER — SODIUM CHLORIDE 0.9 % IV SOLN: 500.0000 mL | INTRAVENOUS | Status: DC

## 2023-08-16 NOTE — Progress Notes (Unsigned)
GASTROENTEROLOGY PROCEDURE H&P NOTE   Primary Care Physician: [place], Betty G, MD    Reason for Procedure:   PMS2 Lynch syndrome  Plan:    EGD  Patient is appropriate for endoscopic procedure(s) in the ambulatory ([facility]) setting.  The nature of the procedure, as well as the risks, benefits, and alternatives were carefully and thoroughly reviewed with the patient. Ample time for discussion and questions allowed. The patient understood, was satisfied, and agreed to proceed.     HPI: Desiree Campbell is a 42 y.o. female who presents for EGD for evaluation of PMS2 Lynch syndrome .  Patient was most recently seen in the Gastroenterology Clinic on 08/07/23.  No interval change in medical history since that appointment. Please refer to that note for full details regarding GI history and clinical presentation.   Past Medical History:  Diagnosis Date   Anxiety    hx of   Constipation    Hyperlipidemia    diet controlled   Hyperlipidemia    Hypertension    on meds   Lynch syndrome    Positive TB test    cleared by cxr per patient    Past Surgical History:  Procedure Laterality Date   EYE SURGERY  1983   WISDOM TOOTH EXTRACTION  2015    Prior to Admission medications   Medication Sig Start Date End Date Taking? Authorizing Provider  amLODipine (NORVASC) 10 MG tablet Take 1 tablet (10 mg total) by mouth daily. 07/11/23  Yes [place], Betty G, MD  minoxidil (LONITEN) 2.5 MG tablet Take 1/2 tablet (1.25 mg total) by mouth daily. 07/05/23  Yes [place], Betty G, MD  propranolol (INDERAL) 20 MG tablet Take 1 tablet (20 mg total) by mouth daily as needed. 07/05/23  Yes [place], Betty G, MD  rosuvastatin (CRESTOR) 10 MG tablet Take 1 tablet (10 mg total) by mouth daily. 07/11/23  Yes [place], Betty G, MD  traZODone (DESYREL) 50 MG tablet Take 0.5-1 tablets (25-50 mg total) by mouth at bedtime as needed for sleep. 07/05/23  Yes [place], Betty G, MD  zolpidem (AMBIEN CR) 6.25 MG CR  tablet Take 1 tablet (6.25 mg total) by mouth at bedtime as needed for sleep. 04/19/23   [place], Betty G, MD    Current Outpatient Medications  Medication Sig Dispense Refill   amLODipine (NORVASC) 10 MG tablet Take 1 tablet (10 mg total) by mouth daily. 90 tablet 2   minoxidil (LONITEN) 2.5 MG tablet Take 1/2 tablet (1.25 mg total) by mouth daily. 90 tablet 0   propranolol (INDERAL) 20 MG tablet Take 1 tablet (20 mg total) by mouth daily as needed. 90 tablet 3   rosuvastatin (CRESTOR) 10 MG tablet Take 1 tablet (10 mg total) by mouth daily. 90 tablet 3   traZODone (DESYREL) 50 MG tablet Take 0.5-1 tablets (25-50 mg total) by mouth at bedtime as needed for sleep. 30 tablet 1   zolpidem (AMBIEN CR) 6.25 MG CR tablet Take 1 tablet (6.25 mg total) by mouth at bedtime as needed for sleep. 30 tablet 0   Current Facility-Administered Medications  Medication Dose Route Frequency Provider Last Rate Last Admin   0.9 %  sodium chloride infusion  500 mL Intravenous Continuous Imogene Burn, MD        Allergies as of 08/16/2023   (No Known Allergies)    Family History  Problem Relation Age of Onset   Diabetes Mother    Hyperlipidemia Mother    Miscarriages / [place]  Mother    Luiz Blare' disease Mother    Colon polyps Father 46   Hyperlipidemia Father    Arthritis Father    Hyperlipidemia Sister    Breast cancer Paternal Aunt 45 - 76   Alcohol abuse Maternal Grandmother    Arthritis Paternal Grandmother    Breast cancer Paternal Grandmother 29 - 69   Uterine cancer Cousin        paternal half-cousin   Ovarian cancer Other 63 - 62       maternal half-aunt   Colon cancer Neg Hx    Rectal cancer Neg Hx    Stomach cancer Neg Hx    Esophageal cancer Neg Hx     Social History   Socioeconomic History   Marital status: Married    Spouse name: Not on file   Number of children: 2   Years of education: Not on file   Highest education level: Professional school degree (e.g., MD, DDS, DVM,  JD)  Occupational History   Occupation: Pediatrician  Tobacco Use   Smoking status: Never   Smokeless tobacco: Never  Vaping Use   Vaping status: Never Used  Substance and Sexual Activity   Alcohol use: Not Currently   Drug use: No   Sexual activity: Not on file  Other Topics Concern   Not on file  Social History Narrative   Not on file   Social Drivers of Health   Financial Resource Strain: Low Risk  (07/28/2021)   Overall Financial Resource Strain (CARDIA)    Difficulty of Paying Living Expenses: Not hard at all  Food Insecurity: No Food Insecurity (07/28/2021)   Hunger Vital Sign    Worried About Running Out of Food in the Last Year: Never true    Ran Out of Food in the Last Year: Never true  Transportation Needs: No Transportation Needs (07/28/2021)   PRAPARE - Administrator, Civil Service (Medical): No    Lack of Transportation (Non-Medical): No  Physical Activity: Sufficiently Active (07/28/2021)   Exercise Vital Sign    Days of Exercise per Week: 3 days    Minutes of Exercise per Session: 50 min  Stress: No Stress Concern Present (07/28/2021)   [employer] of Occupational Health - Occupational Stress Questionnaire    Feeling of Stress : Not at all  Social Connections: Moderately Integrated (07/28/2021)   Social Connection and Isolation Panel [NHANES]    Frequency of Communication with Friends and Family: Three times a week    Frequency of Social Gatherings with Friends and Family: Twice a week    Attends Religious Services: Never    Database administrator or Organizations: Yes    Attends Engineer, structural: More than 4 times per year    Marital Status: Married  Catering manager Violence: Not on file    Physical Exam: Vital signs in last 24 hours: BP 120/77   Pulse 69   Temp 98.6 F (37 C)   Ht 5\' 8"  (1.727 m)   Wt 155 lb (70.3 kg)   SpO2 98%   BMI 23.57 kg/m  GEN: NAD EYE: Sclerae anicteric ENT: MMM CV:  Non-tachycardic Pulm: No increased WOB GI: Soft NEURO:  Alert & Oriented   Eulah Pont, MD [facility]   08/16/2023 3:51 PM

## 2023-08-16 NOTE — Progress Notes (Signed)
Pt's states no medical or surgical changes since previsit or office visit. 

## 2023-08-16 NOTE — Patient Instructions (Signed)
You may resume all of your previous medications today.  We will be in touch usually through My Chart with the results of the biopsies.  Please read your discharge instructions.  YOU HAD AN ENDOSCOPIC PROCEDURE TODAY AT THE [facility]:   Refer to the procedure report that was given to you for any specific questions about what was found during the examination.  If the procedure report does not answer your questions, please call your gastroenterologist to clarify.  If you requested that your care partner not be given the details of your procedure findings, then the procedure report has been included in a sealed envelope for you to review at your convenience later.  YOU SHOULD EXPECT: Some feelings of bloating in the abdomen. Passage of more gas than usual.  Please Note:  You might notice some irritation and congestion in your nose or some drainage.  This is from the oxygen used during your procedure.  There is no need for concern and it should clear up in a day or so.  SYMPTOMS TO REPORT IMMEDIATELY:   Following upper endoscopy (EGD)  Vomiting of blood or coffee ground material  New chest pain or pain under the shoulder blades  Painful or persistently difficult swallowing  New shortness of breath  Fever of 100F or higher  Black, tarry-looking stools  For urgent or emergent issues, a gastroenterologist can be reached at any hour by calling [phone]. Do not use MyChart messaging for urgent concerns.    DIET:  We do recommend a small meal at first, but then you may proceed to your regular diet.  Drink plenty of fluids but you should avoid alcoholic beverages for 24 hours.  ACTIVITY:  You should plan to take it easy for the rest of today and you should NOT DRIVE or use heavy machinery until tomorrow (because of the sedation medicines used during the test).    FOLLOW UP: Our staff will call the number listed on your records the next business day following your procedure.  We  will call around 7:15- 8:00 am to check on you and address any questions or concerns that you may have regarding the information given to you following your procedure. If we do not reach you, we will leave a message.     If any biopsies were taken you will be contacted by phone or by letter within the next 1-3 weeks.  Please call us at [phone] if you have not heard about the biopsies in 3 weeks.    SIGNATURES/CONFIDENTIALITY: You and/or your care partner have signed paperwork which will be entered into your electronic medical record.  These signatures attest to the fact that that the information above on your After Visit Summary has been reviewed and is understood.  Full responsibility of the confidentiality of this discharge information lies with you and/or your care-partner.

## 2023-08-16 NOTE — Op Note (Signed)
[facility] Patient Name: Desiree Campbell Procedure Date: 08/16/2023 3:50 PM MRN: [id number] Endoscopist: Particia Lather , , [id number] Age: 42 Referring MD:  Date of Birth: [birth year] Gender: Female Account #: [id number] Procedure:                Upper GI endoscopy Indications:              Hereditary nonpolyposis colorectal cancer (Lynch                            Syndrome) Medicines:                Monitored Anesthesia Care Procedure:                Pre-Anesthesia Assessment:                           - Prior to the procedure, a History and Physical                            was performed, and patient medications and                            allergies were reviewed. The patient's tolerance of                            previous anesthesia was also reviewed. The risks                            and benefits of the procedure and the sedation                            options and risks were discussed with the patient.                            All questions were answered, and informed consent                            was obtained. Prior Anticoagulants: The patient has                            taken no anticoagulant or antiplatelet agents. ASA                            Grade Assessment: II - A patient with mild systemic                            disease. After reviewing the risks and benefits,                            the patient was deemed in satisfactory condition to                            undergo the procedure.  After obtaining informed consent, the endoscope was                            passed under direct vision. Throughout the                            procedure, the patient's blood pressure, pulse, and                            oxygen saturations were monitored continuously. The                            GIF [id number] #[id number] was introduced through the                            mouth, and advanced to the second part  of duodenum.                            The upper GI endoscopy was accomplished without                            difficulty. The patient tolerated the procedure                            well. Scope In: Scope Out: Findings:                 The examined esophagus was normal.                           Localized mildly erythematous mucosa without                            bleeding was found in the gastric antrum. Biopsies                            were taken with a cold forceps for histology.                           Six diminutive sessile polyps with no bleeding were                            found in the duodenal bulb. These polyps were                            removed with a cold biopsy forceps. Resection and                            retrieval were complete. Complications:            No immediate complications. Estimated Blood Loss:     Estimated blood loss was minimal. Impression:               - Normal esophagus.                           -  Erythematous mucosa in the antrum. Biopsied.                           - Six duodenal polyps. Resected and retrieved. Recommendation:           - Discharge patient to home (with escort).                           - Await pathology results.                           - The findings and recommendations were discussed                            with the patient. Dr Particia Lather "Alan Ripper" Leonides Schanz,  08/16/2023 4:19:54 PM

## 2023-08-16 NOTE — Progress Notes (Unsigned)
Sedate, gd SR, tolerated procedure well, VSS, report to RN 

## 2023-08-17 ENCOUNTER — Telehealth: Payer: Self-pay

## 2023-08-17 NOTE — Telephone Encounter (Signed)
  Follow up Call-     08/16/2023    3:09 PM 04/26/2023   10:35 AM  Call back number  Post procedure Call Back phone  # [phone]  Permission to leave phone message Yes Yes     Patient questions:  Do you have a fever, pain , or abdominal swelling? No. Pain Score  0 *  Have you tolerated food without any problems? Yes.    Have you been able to return to your normal activities? Yes.    Do you have any questions about your discharge instructions: Diet   No. Medications  No. Follow up visit  No.  Do you have questions or concerns about your Care? No.  Actions: * If pain score is 4 or above: No action needed, pain <4.

## 2023-08-21 ENCOUNTER — Encounter: Payer: Self-pay | Admitting: Internal Medicine

## 2023-08-21 LAB — SURGICAL PATHOLOGY

## 2023-09-14 DIAGNOSIS — N939 Abnormal uterine and vaginal bleeding, unspecified: Secondary | ICD-10-CM | POA: Diagnosis not present

## 2023-10-19 ENCOUNTER — Other Ambulatory Visit: Payer: Self-pay

## 2023-10-19 ENCOUNTER — Other Ambulatory Visit (HOSPITAL_COMMUNITY): Payer: Self-pay

## 2023-10-23 ENCOUNTER — Other Ambulatory Visit (HOSPITAL_COMMUNITY): Payer: Self-pay

## 2023-10-25 DIAGNOSIS — D259 Leiomyoma of uterus, unspecified: Secondary | ICD-10-CM | POA: Diagnosis not present

## 2023-10-25 DIAGNOSIS — R339 Retention of urine, unspecified: Secondary | ICD-10-CM | POA: Diagnosis not present

## 2023-10-25 DIAGNOSIS — Z30431 Encounter for routine checking of intrauterine contraceptive device: Secondary | ICD-10-CM | POA: Diagnosis not present

## 2023-10-25 DIAGNOSIS — N3946 Mixed incontinence: Secondary | ICD-10-CM | POA: Diagnosis not present

## 2023-11-23 IMAGING — MG MM DIGITAL DIAGNOSTIC UNILAT*L* W/ TOMO W/ CAD
6 series · 6 of 18 positions shown · non-contrast
Comparison: Previous exam(s).

CLINICAL DATA: 41-year-old female recalled from screening mammogram
dated 10/25/2021 for a possible left breast mass.

EXAM:
DIGITAL DIAGNOSTIC UNILATERAL LEFT MAMMOGRAM WITH TOMOSYNTHESIS AND
CAD; ULTRASOUND LEFT BREAST LIMITED
TECHNIQUE: Left digital diagnostic mammography and breast tomosynthesis was
performed. The images were evaluated with computer-aided detection.;
Targeted ultrasound examination of the left breast was performed.

[L CC synth-2D (1 of 2)]
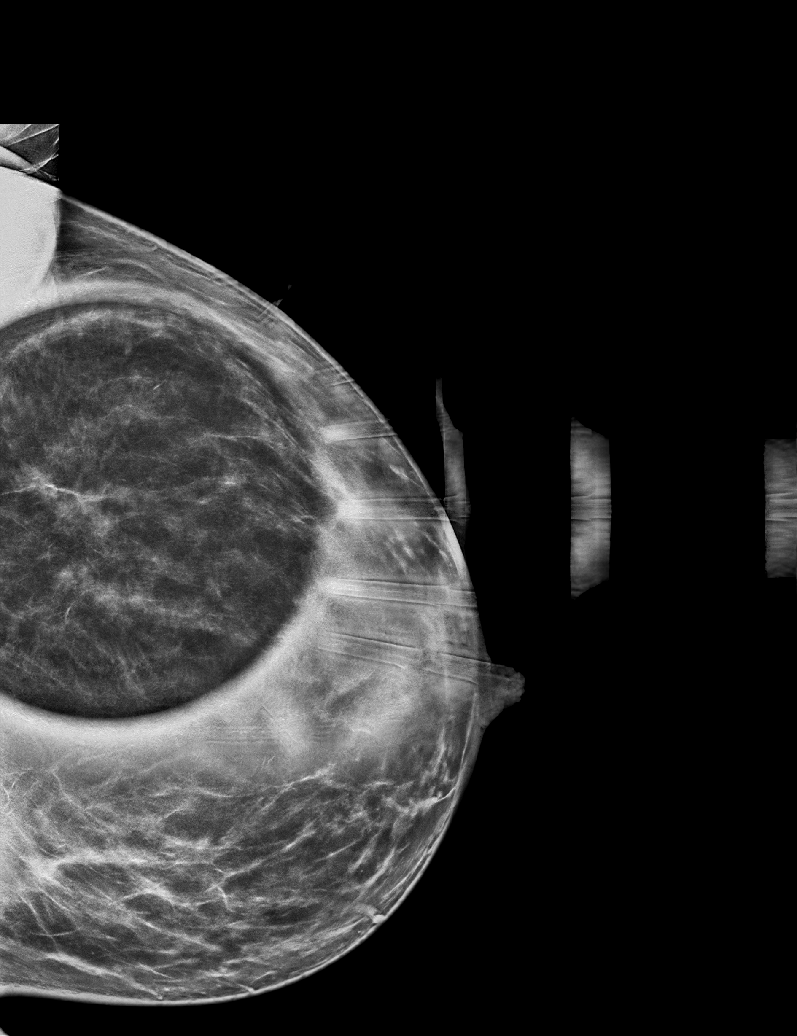

[L CC synth-2D (2 of 2)]
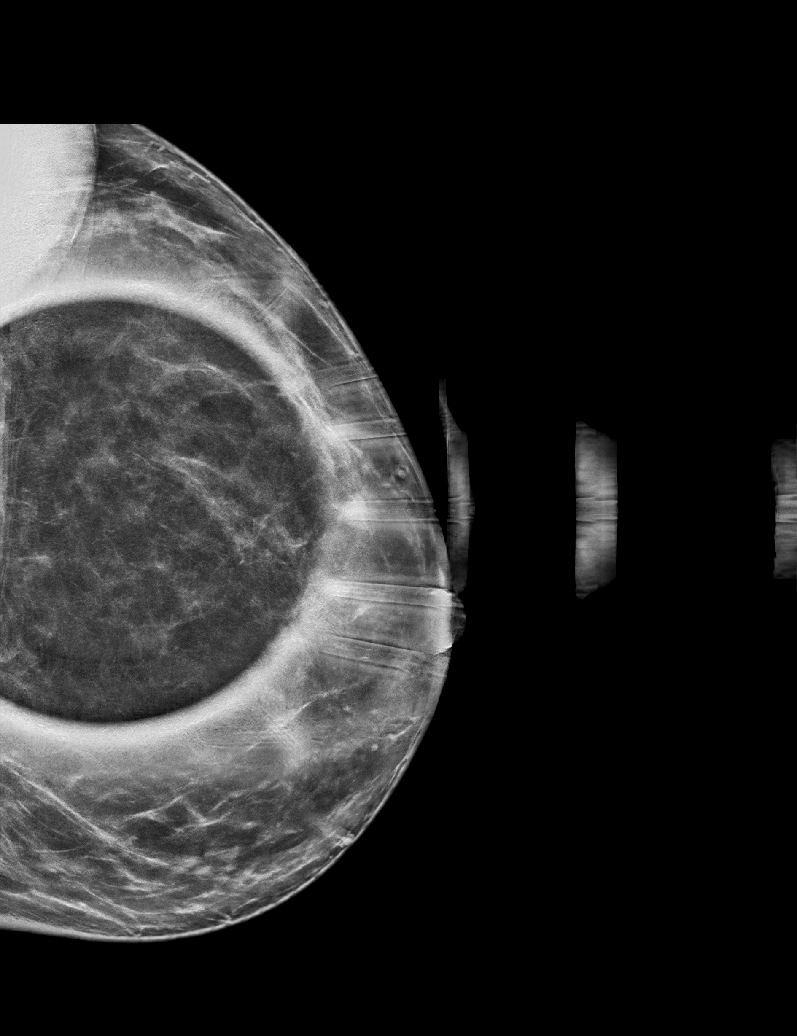

[L MLO synth-2D]
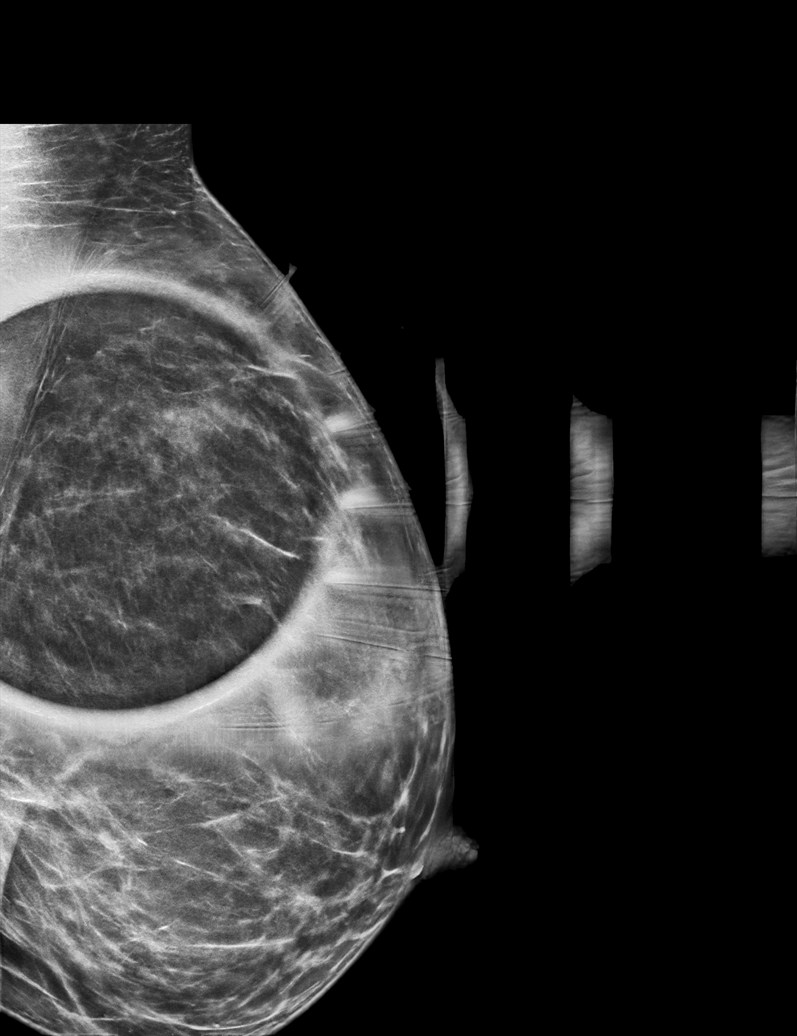

[L CC tomo (1 of 2) · tomo slice 39/76.0]
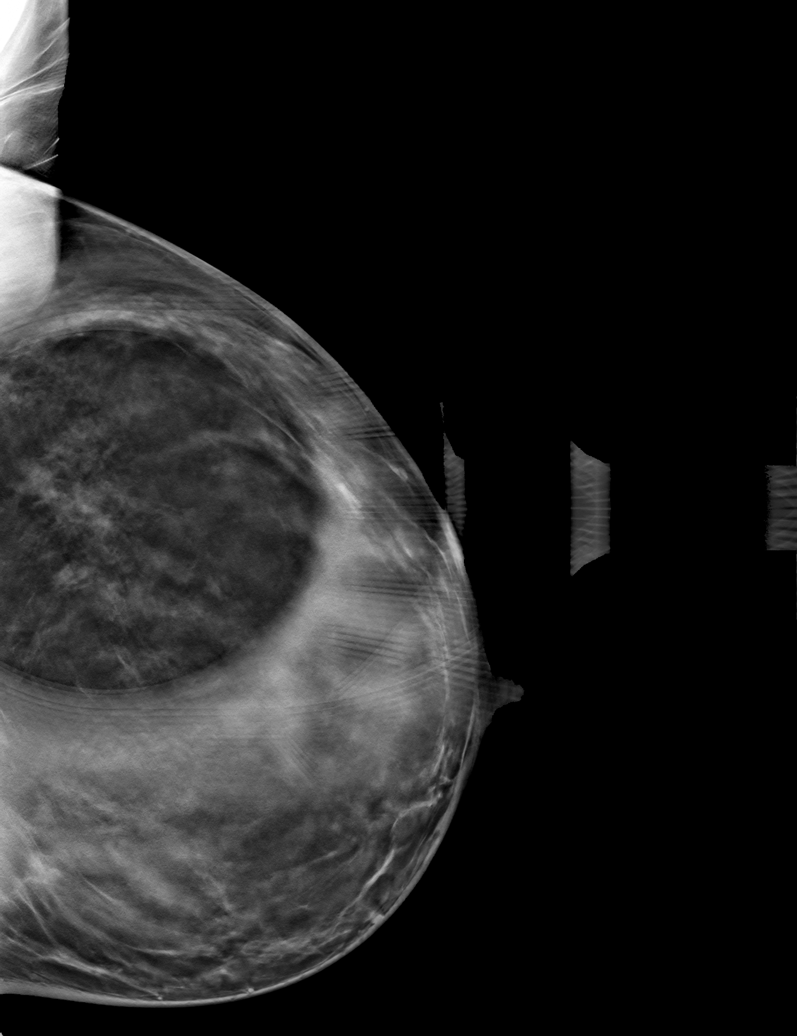

[L CC tomo (2 of 2) · tomo slice 42/83.0]
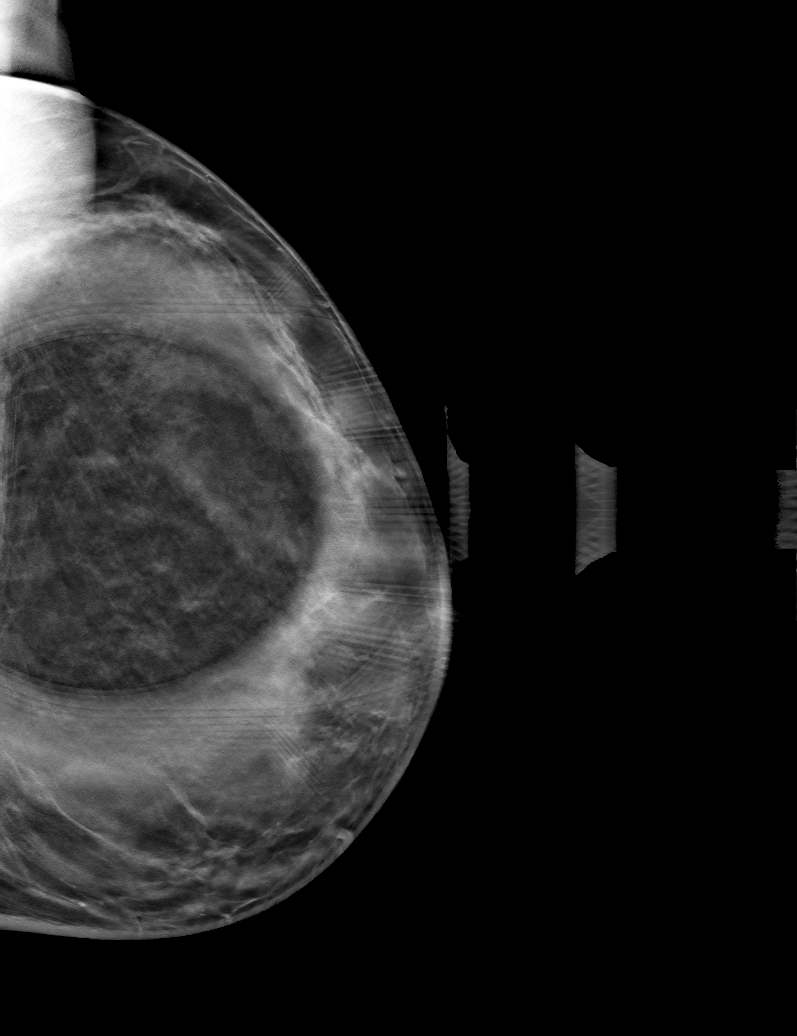

[L MLO tomo · tomo slice 38/75.0]
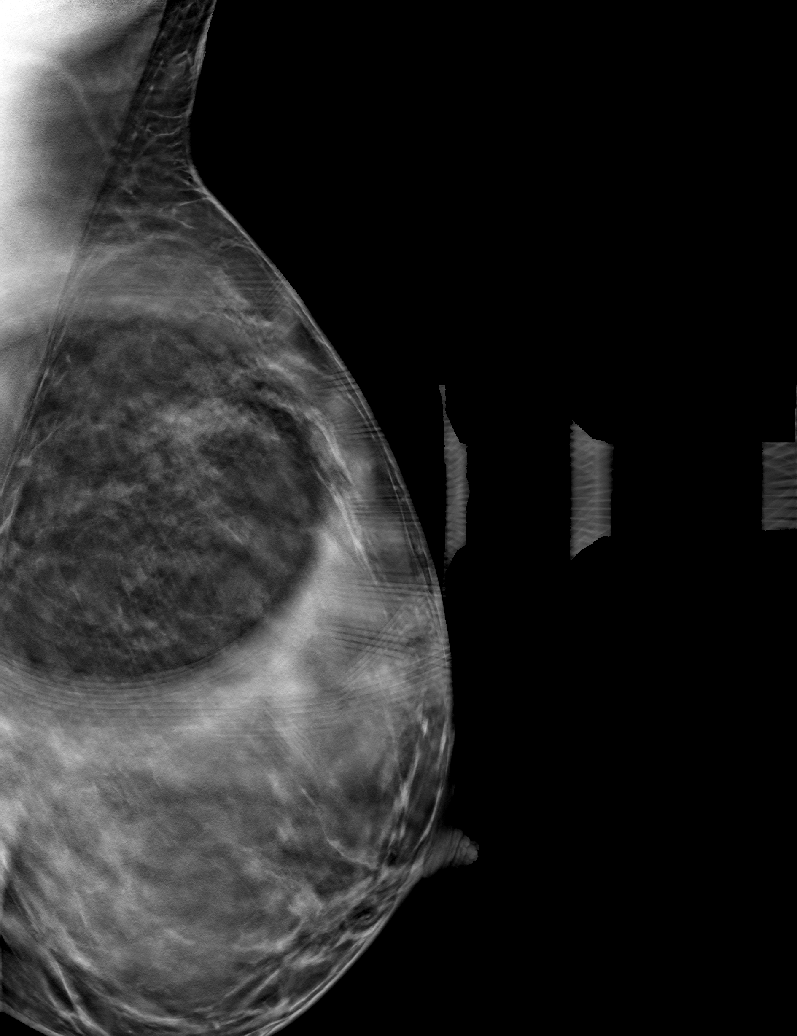

[6 of 18 positions shown; findings below may reference images not displayed]

ACR Breast Density Category b: There are scattered areas of
fibroglandular density.
FINDINGS: There is a persistent oval, circumscribed equal density mass in the
lower outer quadrant of the left breast. Further evaluation with
ultrasound was performed.

Targeted ultrasound is performed, showing an oval, circumscribed
anechoic cyst with single thin internal septation at the 4 o'clock
position 8 cm from the nipple. It measures 9 x 4 x 8 mm. There is no
internal vascularity. This correlates well with the mammographic
finding.
IMPRESSION: Benign left breast cyst corresponding with the screening
mammographic findings. No further follow-up required.

RECOMMENDATION:
Screening mammogram in one year.(Code:SP-J-CS6)

I have discussed the findings and recommendations with the patient.
If applicable, a reminder letter will be sent to the patient
regarding the next appointment.

BI-RADS CATEGORY  2: Benign.

## 2023-12-12 ENCOUNTER — Encounter (HOSPITAL_BASED_OUTPATIENT_CLINIC_OR_DEPARTMENT_OTHER): Payer: Self-pay

## 2023-12-22 ENCOUNTER — Encounter (HOSPITAL_BASED_OUTPATIENT_CLINIC_OR_DEPARTMENT_OTHER): Payer: Self-pay | Admitting: Pulmonary Disease

## 2023-12-22 ENCOUNTER — Encounter (HOSPITAL_BASED_OUTPATIENT_CLINIC_OR_DEPARTMENT_OTHER): Payer: Self-pay

## 2023-12-26 ENCOUNTER — Encounter (HOSPITAL_BASED_OUTPATIENT_CLINIC_OR_DEPARTMENT_OTHER): Payer: Self-pay | Admitting: Pulmonary Disease

## 2023-12-26 ENCOUNTER — Encounter (HOSPITAL_BASED_OUTPATIENT_CLINIC_OR_DEPARTMENT_OTHER): Payer: Self-pay

## 2024-01-17 ENCOUNTER — Ambulatory Visit (HOSPITAL_BASED_OUTPATIENT_CLINIC_OR_DEPARTMENT_OTHER): Payer: 59 | Admitting: Pulmonary Disease

## 2024-02-02 ENCOUNTER — Other Ambulatory Visit: Payer: Self-pay | Admitting: Family Medicine

## 2024-02-02 ENCOUNTER — Other Ambulatory Visit: Payer: Self-pay

## 2024-02-02 ENCOUNTER — Encounter: Payer: Self-pay | Admitting: Pharmacist

## 2024-02-02 ENCOUNTER — Other Ambulatory Visit (HOSPITAL_BASED_OUTPATIENT_CLINIC_OR_DEPARTMENT_OTHER): Payer: Self-pay

## 2024-02-02 DIAGNOSIS — L659 Nonscarring hair loss, unspecified: Secondary | ICD-10-CM

## 2024-02-02 MED ORDER — MINOXIDIL 2.5 MG PO TABS
1.2500 mg | ORAL_TABLET | Freq: Every day | ORAL | 2 refills | Status: AC
  Filled 2024-02-02 – 2024-02-05 (×2): qty 45, 90d supply, fill #0
  Filled 2024-05-19: qty 45, 90d supply, fill #1
  Filled 2024-08-26: qty 45, 90d supply, fill #2

## 2024-02-05 ENCOUNTER — Other Ambulatory Visit (HOSPITAL_COMMUNITY): Payer: Self-pay

## 2024-02-06 ENCOUNTER — Other Ambulatory Visit (HOSPITAL_COMMUNITY): Payer: Self-pay

## 2024-02-06 ENCOUNTER — Other Ambulatory Visit: Payer: Self-pay

## 2024-02-29 DIAGNOSIS — Z01411 Encounter for gynecological examination (general) (routine) with abnormal findings: Secondary | ICD-10-CM | POA: Diagnosis not present

## 2024-02-29 DIAGNOSIS — Z6823 Body mass index (BMI) 23.0-23.9, adult: Secondary | ICD-10-CM | POA: Diagnosis not present

## 2024-02-29 DIAGNOSIS — Z309 Encounter for contraceptive management, unspecified: Secondary | ICD-10-CM | POA: Diagnosis not present

## 2024-02-29 DIAGNOSIS — Z124 Encounter for screening for malignant neoplasm of cervix: Secondary | ICD-10-CM | POA: Diagnosis not present

## 2024-02-29 DIAGNOSIS — Z3040 Encounter for surveillance of contraceptives, unspecified: Secondary | ICD-10-CM | POA: Diagnosis not present

## 2024-02-29 DIAGNOSIS — Z01419 Encounter for gynecological examination (general) (routine) without abnormal findings: Secondary | ICD-10-CM | POA: Diagnosis not present

## 2024-02-29 DIAGNOSIS — Z1231 Encounter for screening mammogram for malignant neoplasm of breast: Secondary | ICD-10-CM | POA: Diagnosis not present

## 2024-03-25 ENCOUNTER — Encounter: Payer: Self-pay | Admitting: Pharmacist

## 2024-03-25 ENCOUNTER — Other Ambulatory Visit: Payer: Self-pay

## 2024-03-26 ENCOUNTER — Other Ambulatory Visit (HOSPITAL_COMMUNITY): Payer: Self-pay

## 2024-03-26 ENCOUNTER — Other Ambulatory Visit: Payer: Self-pay

## 2024-04-01 ENCOUNTER — Ambulatory Visit (HOSPITAL_COMMUNITY)
Admission: EM | Admit: 2024-04-01 | Discharge: 2024-04-01 | Disposition: A | Discharge status: Home / Self Care | Attending: Physician Assistant | Admitting: Physician Assistant

## 2024-04-01 ENCOUNTER — Encounter (HOSPITAL_COMMUNITY): Payer: Self-pay

## 2024-04-01 ENCOUNTER — Encounter (HOSPITAL_BASED_OUTPATIENT_CLINIC_OR_DEPARTMENT_OTHER): Payer: Self-pay

## 2024-04-01 ENCOUNTER — Other Ambulatory Visit: Payer: Self-pay

## 2024-04-01 ENCOUNTER — Emergency Department (HOSPITAL_BASED_OUTPATIENT_CLINIC_OR_DEPARTMENT_OTHER)

## 2024-04-01 ENCOUNTER — Emergency Department (HOSPITAL_BASED_OUTPATIENT_CLINIC_OR_DEPARTMENT_OTHER)
Admission: EM | Admit: 2024-04-01 | Discharge: 2024-04-02 | Disposition: A | Discharge status: Home / Self Care | Attending: Emergency Medicine | Admitting: Emergency Medicine

## 2024-04-01 DIAGNOSIS — D259 Leiomyoma of uterus, unspecified: Secondary | ICD-10-CM | POA: Diagnosis not present

## 2024-04-01 DIAGNOSIS — Z79899 Other long term (current) drug therapy: Secondary | ICD-10-CM | POA: Insufficient documentation

## 2024-04-01 DIAGNOSIS — R1084 Generalized abdominal pain: Secondary | ICD-10-CM

## 2024-04-01 DIAGNOSIS — N83201 Unspecified ovarian cyst, right side: Secondary | ICD-10-CM | POA: Insufficient documentation

## 2024-04-01 DIAGNOSIS — R1031 Right lower quadrant pain: Secondary | ICD-10-CM | POA: Diagnosis not present

## 2024-04-01 DIAGNOSIS — I10 Essential (primary) hypertension: Secondary | ICD-10-CM | POA: Insufficient documentation

## 2024-04-01 LAB — POCT URINALYSIS DIP (MANUAL ENTRY)
Bilirubin, UA: NEGATIVE
Blood, UA: NEGATIVE
Glucose, UA: NEGATIVE mg/dL
Ketones, POC UA: NEGATIVE mg/dL
Leukocytes, UA: NEGATIVE
Nitrite, UA: NEGATIVE
Protein Ur, POC: NEGATIVE mg/dL
Spec Grav, UA: 1.01 (ref 1.010–1.025)
Urobilinogen, UA: 0.2 U/dL
pH, UA: 6.5 (ref 5.0–8.0)

## 2024-04-01 LAB — URINALYSIS, ROUTINE W REFLEX MICROSCOPIC
Bilirubin Urine: NEGATIVE
Glucose, UA: NEGATIVE mg/dL
Hgb urine dipstick: NEGATIVE
Ketones, ur: NEGATIVE mg/dL
Leukocytes,Ua: NEGATIVE
Nitrite: NEGATIVE
Protein, ur: NEGATIVE mg/dL
Specific Gravity, Urine: 1.01 (ref 1.005–1.030)
pH: 7 (ref 5.0–8.0)

## 2024-04-01 LAB — COMPREHENSIVE METABOLIC PANEL WITH GFR
ALT: 12 U/L (ref 0–44)
AST: 23 U/L (ref 15–41)
Albumin: 4.9 g/dL (ref 3.5–5.0)
Alkaline Phosphatase: 63 U/L (ref 38–126)
Anion gap: 12 (ref 5–15)
BUN: 11 mg/dL (ref 6–20)
CO2: 26 mmol/L (ref 22–32)
Calcium: 10 mg/dL (ref 8.9–10.3)
Chloride: 100 mmol/L (ref 98–111)
Creatinine, Ser: 0.86 mg/dL (ref 0.44–1.00)
GFR, Estimated: >60 mL/min (ref >60)
Glucose, Bld: 91 mg/dL (ref 70–99)
Potassium: 3.4 mmol/L — ABNORMAL LOW (ref 3.5–5.1)
Sodium: 137 mmol/L (ref 135–145)
Total Bilirubin: 0.4 mg/dL (ref 0.0–1.2)
Total Protein: 8.7 g/dL — ABNORMAL HIGH (ref 6.5–8.1)

## 2024-04-01 LAB — CBC
HCT: 46.6 % — ABNORMAL HIGH (ref 36.0–46.0)
Hemoglobin: 15.7 g/dL — ABNORMAL HIGH (ref 12.0–15.0)
MCH: 29.9 pg (ref 26.0–34.0)
MCHC: 33.7 g/dL (ref 30.0–36.0)
MCV: 88.8 fL (ref 80.0–100.0)
Platelets: 301 K/uL (ref 150–400)
RBC: 5.25 MIL/uL — ABNORMAL HIGH (ref 3.87–5.11)
RDW: 13 % (ref 11.5–15.5)
WBC: 7.4 K/uL (ref 4.0–10.5)
nRBC: 0 % (ref 0.0–0.2)

## 2024-04-01 LAB — LIPASE, BLOOD: Lipase: 20 U/L (ref 11–51)

## 2024-04-01 LAB — PREGNANCY, URINE: Preg Test, Ur: NEGATIVE

## 2024-04-01 MED ORDER — IOHEXOL 300 MG/ML  SOLN
100.0000 mL | Freq: Once | INTRAMUSCULAR | Status: AC | PRN
  Administered 2024-04-01: 100 mL via INTRAVENOUS

## 2024-04-01 MED ORDER — MORPHINE SULFATE (PF) 4 MG/ML IV SOLN
4.0000 mg | Freq: Once | INTRAVENOUS | Status: AC
  Administered 2024-04-01: 4 mg via INTRAVENOUS
  Filled 2024-04-01: qty 1

## 2024-04-01 MED ORDER — SODIUM CHLORIDE 0.9 % IV BOLUS
1000.0000 mL | Freq: Once | INTRAVENOUS | Status: AC
  Administered 2024-04-01: 1000 mL via INTRAVENOUS

## 2024-04-01 MED ORDER — ONDANSETRON HCL 4 MG/2ML IJ SOLN
4.0000 mg | Freq: Once | INTRAMUSCULAR | Status: AC
  Administered 2024-04-01: 4 mg via INTRAVENOUS
  Filled 2024-04-01: qty 2

## 2024-04-01 NOTE — Discharge Instructions (Addendum)
 Recommend further evaluation in the ED to rule out appendicitis.

## 2024-04-01 NOTE — ED Provider Notes (Signed)
 [facility]    CSN: [id number] Arrival date & time: 04/01/24  1858      History   Chief Complaint Chief Complaint  Patient presents with   Abdominal Pain    HPI Desiree Campbell is a 43 y.o. female.   Patient presents with right lower quadrant pain that started about 2 days ago.  She reports intermittent nausea.  Denies vomiting.  She reports some constipation and took a stool softener with improvement.  Reports 2 bowel movements today.  She denies fever, chills.    Past Medical History:  Diagnosis Date   Anxiety    hx of   Constipation    Hyperlipidemia    diet controlled   Hyperlipidemia    Hypertension    on meds   Lynch syndrome    Positive TB test    cleared by cxr per patient    Patient Active Problem List   Diagnosis Date Noted   Situational anxiety 07/05/2023   Hair thinning 07/05/2023   Routine general medical examination at a health care facility 04/20/2023   Insomnia 04/19/2023   Monoallelic mutation of PMS2 gene 10/13/2022   Hyperlipidemia 09/11/2018   Hypertension, essential, benign 09/11/2018    Past Surgical History:  Procedure Laterality Date   EYE SURGERY  1983   WISDOM TOOTH EXTRACTION  2015    OB History   No obstetric history on file.      Home Medications    Prior to Admission medications   Medication Sig Start Date End Date Taking? Authorizing Provider  amLODipine  (NORVASC ) 10 MG tablet Take 1 tablet (10 mg total) by mouth daily. 07/11/23   [place], Betty G, MD  minoxidil  (LONITEN ) 2.5 MG tablet Take 1/2 tablets (1.25 mg total) by mouth daily. 02/02/24   [place], Betty G, MD  propranolol  (INDERAL ) 20 MG tablet Take 1 tablet (20 mg total) by mouth daily as needed. 07/05/23   [place], Betty G, MD  rosuvastatin  (CRESTOR ) 10 MG tablet Take 1 tablet (10 mg total) by mouth daily. 07/11/23   [place], Betty G, MD  traZODone  (DESYREL ) 50 MG tablet Take 0.5-1 tablets (25-50 mg total) by mouth at bedtime as needed for  sleep. 07/05/23   [place], Betty G, MD  zolpidem  (AMBIEN  CR) 6.25 MG CR tablet Take 1 tablet (6.25 mg total) by mouth at bedtime as needed for sleep. Patient not taking: Reported on 04/01/2024 04/19/23   [place], Betty G, MD    Family History Family History  Problem Relation Age of Onset   Diabetes Mother    Hyperlipidemia Mother    Miscarriages / [place] Mother    Yvone' disease Mother    Colon polyps Father 92   Hyperlipidemia Father    Arthritis Father    Hyperlipidemia Sister    Breast cancer Paternal Aunt 66 - 59   Alcohol abuse Maternal Grandmother    Arthritis Paternal Grandmother    Breast cancer Paternal Grandmother 48 - 69   Uterine cancer Cousin        paternal half-cousin   Ovarian cancer Other 50 - 69       maternal half-aunt   Colon cancer Neg Hx    Rectal cancer Neg Hx    Stomach cancer Neg Hx    Esophageal cancer Neg Hx     Social History Social History   Tobacco Use   Smoking status: Never   Smokeless tobacco: Never  Vaping Use   Vaping status: Never Used  Substance Use  Topics   Alcohol use: Not Currently   Drug use: No     Allergies   Patient has no known allergies.   Review of Systems Review of Systems  Constitutional:  Negative for chills and fever.  HENT:  Negative for ear pain and sore throat.   Eyes:  Negative for pain and visual disturbance.  Respiratory:  Negative for cough and shortness of breath.   Cardiovascular:  Negative for chest pain and palpitations.  Gastrointestinal:  Positive for abdominal pain and nausea. Negative for vomiting.  Genitourinary:  Negative for dysuria and hematuria.  Musculoskeletal:  Negative for arthralgias and back pain.  Skin:  Negative for color change and rash.  Neurological:  Negative for seizures and syncope.  All other systems reviewed and are negative.    Physical Exam Triage Vital Signs ED Triage Vitals  Encounter Vitals Group     BP 04/01/24 1945 125/84     Girls Systolic BP Percentile  --      Girls Diastolic BP Percentile --      Boys Systolic BP Percentile --      Boys Diastolic BP Percentile --      Pulse Rate 04/01/24 1945 64     Resp 04/01/24 1945 14     Temp 04/01/24 1945 98.1 F (36.7 C)     Temp Source 04/01/24 1945 Oral     SpO2 04/01/24 1945 95 %     Weight --      Height --      Head Circumference --      Peak Flow --      Pain Score 04/01/24 1944 7     Pain Loc --      Pain Education --      Exclude from Growth Chart --    No data found.  Updated Vital Signs BP 125/84 (BP Location: Right Arm)   Pulse 64   Temp 98.1 F (36.7 C) (Oral)   Resp 14   SpO2 95%   Visual Acuity Right Eye Distance:   Left Eye Distance:   Bilateral Distance:    Right Eye Near:   Left Eye Near:    Bilateral Near:     Physical Exam Vitals and nursing note reviewed.  Constitutional:      General: She is not in acute distress.    Appearance: She is well-developed.  HENT:     Head: Normocephalic and atraumatic.  Eyes:     Conjunctiva/sclera: Conjunctivae normal.  Cardiovascular:     Rate and Rhythm: Normal rate and regular rhythm.     Heart sounds: No murmur heard. Pulmonary:     Effort: Pulmonary effort is normal. No respiratory distress.     Breath sounds: Normal breath sounds.  Abdominal:     Palpations: Abdomen is soft.     Tenderness: There is abdominal tenderness in the right lower quadrant. Positive signs include McBurney's sign.  Musculoskeletal:        General: No swelling.     Cervical back: Neck supple.  Skin:    General: Skin is warm and dry.     Capillary Refill: Capillary refill takes less than 2 seconds.  Neurological:     Mental Status: She is alert.  Psychiatric:        Mood and Affect: Mood normal.      UC Treatments / Results  Labs (all labs ordered are listed, but only abnormal results are displayed) Labs Reviewed  POCT URINALYSIS DIP (MANUAL ENTRY)  EKG   Radiology No results found.  Procedures Procedures  (including critical care time)  Medications Ordered in UC Medications - No data to display  Initial Impression / Assessment and Plan / UC Course  I have reviewed the triage vital signs and the nursing notes.  Pertinent labs & imaging results that were available during my care of the patient were reviewed by me and considered in my medical decision making (see chart for details).     Given right lower quadrant pain advise evaluation in the emergency department as we cannot rule out appendicitis here in urgent care setting. Final Clinical Impressions(s) / UC Diagnoses   Final diagnoses:  Generalized abdominal pain  RLQ abdominal pain     Discharge Instructions      Recommend further evaluation in the ED to rule out appendicitis.     ED Prescriptions   None    PDMP not reviewed this encounter.   Ward, Harlene PEDLAR, PA-C 04/01/24 2025

## 2024-04-01 NOTE — ED Triage Notes (Signed)
 Patient c/o RLQ abdominal pain and slight nausea x 2 days. Patient states that she felt constipated and had not had a BM in 2 days   Patient  states she took a stool softener last night and had 2 soft BM's today.

## 2024-04-01 NOTE — ED Notes (Signed)
 Ultrasound at bedside

## 2024-04-01 NOTE — ED Triage Notes (Signed)
 Pt presents with RLQ abd pain that started yesterday with some associated nausea. Denies fever, chills, diarrhea, urinary or vaginal symptoms. Pt was seen and UC and referred to ED.

## 2024-04-01 NOTE — ED Provider Notes (Signed)
 [place] EMERGENCY DEPARTMENT AT [facility] Provider Note   CSN: [id number] Arrival date & time: 04/01/24  2033     Patient presents with: Abdominal Pain   Desiree Campbell is a 43 y.o. female.   Pt is a 43 yo female with pmhx significant for htn,fibroids, lynch syndrome, hld, and constipation.  Pt developed RLQ pain yesterday that worsened today.  She has had a little nausea, but no fever.  She took a stool softener today and had 2 bowel movements without improvement in sx.  She went to UC and was sent here for further eval.         Prior to Admission medications   Medication Sig Start Date End Date Taking? Authorizing Provider  amLODipine  (NORVASC ) 10 MG tablet Take 1 tablet (10 mg total) by mouth daily. 07/11/23   [place], Betty G, MD  minoxidil  (LONITEN ) 2.5 MG tablet Take 1/2 tablets (1.25 mg total) by mouth daily. 02/02/24   [place], Betty G, MD  propranolol  (INDERAL ) 20 MG tablet Take 1 tablet (20 mg total) by mouth daily as needed. 07/05/23   [place], Betty G, MD  rosuvastatin  (CRESTOR ) 10 MG tablet Take 1 tablet (10 mg total) by mouth daily. 07/11/23   [place], Betty G, MD  traZODone  (DESYREL ) 50 MG tablet Take 0.5-1 tablets (25-50 mg total) by mouth at bedtime as needed for sleep. 07/05/23   [place], Betty G, MD  zolpidem  (AMBIEN  CR) 6.25 MG CR tablet Take 1 tablet (6.25 mg total) by mouth at bedtime as needed for sleep. Patient not taking: Reported on 04/01/2024 04/19/23   [place], Betty G, MD    Allergies: Patient has no known allergies.    Review of Systems  Gastrointestinal:  Positive for abdominal pain.  All other systems reviewed and are negative.   Updated Vital Signs BP (!) 128/93   Pulse 63   Temp 98.4 F (36.9 C) (Oral)   Resp 20   Ht 5' 8 (1.727 m)   Wt 70.3 kg   LMP 03/17/2024   SpO2 100%   BMI 23.57 kg/m   Physical Exam Vitals and nursing note reviewed.  Constitutional:      Appearance: She is well-developed.  HENT:     Head:  Normocephalic and atraumatic.     Mouth/Throat:     Mouth: Mucous membranes are moist.     Pharynx: Oropharynx is clear.  Eyes:     Extraocular Movements: Extraocular movements intact.     Pupils: Pupils are equal, round, and reactive to light.  Cardiovascular:     Rate and Rhythm: Normal rate and regular rhythm.     Heart sounds: Normal heart sounds.  Pulmonary:     Effort: Pulmonary effort is normal.     Breath sounds: Normal breath sounds.  Abdominal:     General: Abdomen is flat. Bowel sounds are normal.     Palpations: Abdomen is soft.     Tenderness: There is abdominal tenderness in the right lower quadrant and left lower quadrant.  Skin:    General: Skin is warm.     Capillary Refill: Capillary refill takes less than 2 seconds.  Neurological:     General: No focal deficit present.     Mental Status: She is alert and oriented to person, place, and time.  Psychiatric:        Mood and Affect: Mood normal.        Behavior: Behavior normal.     (all labs ordered are listed,  but only abnormal results are displayed) Labs Reviewed  COMPREHENSIVE METABOLIC PANEL WITH GFR - Abnormal; Notable for the following components:      Result Value   Potassium 3.4 (*)    Total Protein 8.7 (*)    All other components within normal limits  CBC - Abnormal; Notable for the following components:   RBC 5.25 (*)    Hemoglobin 15.7 (*)    HCT 46.6 (*)    All other components within normal limits  URINALYSIS, ROUTINE W REFLEX MICROSCOPIC - Abnormal; Notable for the following components:   Color, Urine COLORLESS (*)    All other components within normal limits  LIPASE, BLOOD  PREGNANCY, URINE    EKG: None  Radiology: CT ABDOMEN PELVIS W CONTRAST Result Date: 04/01/2024 EXAM: CT ABDOMEN AND PELVIS WITH CONTRAST 04/01/2024 10:23:18 PM TECHNIQUE: CT of the abdomen and pelvis was performed with the administration of intravenous contrast. Multiplanar reformatted images are provided for  review. Automated exposure control, iterative reconstruction, and/or weight based adjustment of the mA/kV was utilized to reduce the radiation dose to as low as reasonably achievable. COMPARISON: None available. CLINICAL HISTORY: RLQ abdominal pain that started yesterday with some associated nausea. Denies fever, chills, diarrhea, urinary or vaginal symptoms. FINDINGS: LOWER CHEST: No acute abnormality. LIVER: No acute abnormality. GALLBLADDER AND BILE DUCTS: Gallbladder is unremarkable. No biliary ductal dilatation. SPLEEN: No acute abnormality. PANCREAS: No acute abnormality. ADRENAL GLANDS: No acute abnormality. KIDNEYS, URETERS AND BLADDER: No stones in the kidneys or ureters. No hydronephrosis. No perinephric or periureteral stranding. Urinary bladder is compressed by the large uterine fibroid. GI AND BOWEL: Stomach demonstrates no acute abnormality. There is no bowel obstruction. No bowel wall thickening. Normal appendix. PERITONEUM AND RETROPERITONEUM: No ascites. No free air. VASCULATURE: Aorta is normal in caliber. LYMPH NODES: No lymphadenopathy. REPRODUCTIVE ORGANS: Uterus contains fibroids with the largest fibroid measuring 13.7 x 12.7 cm. IUD in the uterus. Lesion in the anterior right abdomen containing macroscopic fat and a soft tissue nodule measuring 3.0 x 2.5 cm (series 2, image 47). It is unclear whether this arises from the colon or more likely the right adnexa which is displaced by the large fibroid. BONES AND SOFT TISSUES: No acute osseous abnormality. No focal soft tissue abnormality. IMPRESSION: 1. Large fibroid measuring 13.7 x 12.7 cm compressing the dome of the bladder. 2. Soft tissue nodule measuring 3.0 cm and presumably arising from the right adnexa in which case this would represent a dermoid. 3. Gynecology referral is recommended. Electronically signed by: Norman Gatlin MD 04/01/2024 11:03 PM EDT RP Workstation: [id number]     Procedures   Medications Ordered in the ED  sodium  chloride 0.9 % bolus 1,000 mL (1,000 mLs Intravenous New Bag/Given 04/01/24 2318)  iohexol  (OMNIPAQUE ) 300 MG/ML solution 100 mL (100 mLs Intravenous Contrast Given 04/01/24 2218)  morphine  (PF) 4 MG/ML injection 4 mg (4 mg Intravenous Given 04/01/24 2339)  ondansetron  (ZOFRAN ) injection 4 mg (4 mg Intravenous Given 04/01/24 2339)                                    Medical Decision Making Amount and/or Complexity of Data Reviewed Labs: ordered. Radiology: ordered.  Risk Prescription drug management.   This patient presents to the ED for concern of rlq pain, this involves an extensive number of treatment options, and is a complaint that carries with it a high risk of complications and  morbidity.  The differential diagnosis includes appy, ovarian cyst, uti, pregnancy   Co morbidities that complicate the patient evaluation  htn, lynch syndrome, hld, and constipation   Additional history obtained:  Additional history obtained from epic chart review External records from outside source obtained and reviewed including husband   Lab Tests:  I Ordered, and personally interpreted labs.  The pertinent results include:  cbc nl, cmp nl, ua nl, preg nl   Imaging Studies ordered:  I ordered imaging studies including ct abd/pelvis and US  I independently visualized and interpreted imaging which showed  CT abd/pelvis:  Large fibroid measuring 13.7 x 12.7 cm compressing the dome of the bladder.  2. Soft tissue nodule measuring 3.0 cm and presumably arising from the right  adnexa in which case this would represent a dermoid.  3. Gynecology referral is recommended.   I agree with the radiologist interpretation   Medicines ordered and prescription drug management:  I ordered medication including ivfs  for sx  Reevaluation of the patient after these medicines showed that the patient improved I have reviewed the patients home medicines and have made adjustments as needed   Test  Considered:  ct   Critical Interventions:  Pain control   Problem List / ED Course:  RLQ pain:  likely due to large fibroid.  Pt does have an OBGYN and has discussed hyst.  She will get an US  to r/o torsion.  If this is neg, she can go home with outpatient f/u.   Reevaluation:  After the interventions noted above, I reevaluated the patient and found that they have :improved   Social Determinants of Health:  Lives at home   Dispostion:  Pending at shift change     Final diagnoses:  Uterine leiomyoma, unspecified location  Right lower quadrant abdominal pain    ED Discharge Orders     None          Dean Clarity, MD 04/01/24 2347

## 2024-04-01 NOTE — ED Notes (Signed)
 Patient transported to CT

## 2024-04-02 ENCOUNTER — Ambulatory Visit (HOSPITAL_COMMUNITY)

## 2024-04-17 DIAGNOSIS — D259 Leiomyoma of uterus, unspecified: Secondary | ICD-10-CM | POA: Diagnosis not present

## 2024-04-24 ENCOUNTER — Ambulatory Visit (INDEPENDENT_AMBULATORY_CARE_PROVIDER_SITE_OTHER): Admitting: Family Medicine

## 2024-04-24 VITALS — BP 110/72 | HR 88 | Resp 12 | Ht 68.0 in | Wt 154.5 lb

## 2024-04-24 DIAGNOSIS — Z0000 Encounter for general adult medical examination without abnormal findings: Secondary | ICD-10-CM | POA: Diagnosis not present

## 2024-04-24 DIAGNOSIS — G4700 Insomnia, unspecified: Secondary | ICD-10-CM

## 2024-04-24 DIAGNOSIS — E785 Hyperlipidemia, unspecified: Secondary | ICD-10-CM | POA: Diagnosis not present

## 2024-04-24 DIAGNOSIS — I10 Essential (primary) hypertension: Secondary | ICD-10-CM | POA: Diagnosis not present

## 2024-04-24 DIAGNOSIS — L659 Nonscarring hair loss, unspecified: Secondary | ICD-10-CM | POA: Diagnosis not present

## 2024-04-24 NOTE — Assessment & Plan Note (Addendum)
 Problem has improved with rosuvastatin  10 mg daily. She prefers to hold on blood work at this time, we will plan on fasting lipid panel next year.

## 2024-04-24 NOTE — Assessment & Plan Note (Signed)
 Stable. Continue trazodone  50 mg 1/2 to 1 tablet at bedtime as needed and adequate sleep hygiene.

## 2024-04-24 NOTE — Assessment & Plan Note (Signed)
 Problem is adequately controlled. Continue amlodipine  10 mg daily and low-salt diet. Monitor BP at home. Follow-up in a year, before if needed

## 2024-04-24 NOTE — Assessment & Plan Note (Signed)
 Minoxidil  1.25 mg daily has greatly helped, she would like to continue it. She understands side effects.

## 2024-04-24 NOTE — Assessment & Plan Note (Signed)
We discussed the importance of regular physical activity and healthy diet for prevention of chronic illness and/or complications. Preventive guidelines reviewed. Vaccination up-to-date. Continue female preventive care with gynecologist. Next CPE in a year.

## 2024-04-24 NOTE — Patient Instructions (Addendum)
 A few things to remember from today's visit:  Routine general medical examination at a health care facility  Hyperlipidemia, unspecified hyperlipidemia type  Insomnia, unspecified type  Hair thinning  Hypertension, essential, benign  If you need refills for medications you take chronically, please call your pharmacy. Do not use My Chart to request refills or for acute issues that need immediate attention. If you send a my chart message, it may take a few days to be addressed, specially if I am not in the office.  Please be sure medication list is accurate. If a new problem present, please set up appointment sooner than planned today.  Health Maintenance, Female Adopting a healthy lifestyle and getting preventive care are important in promoting health and wellness. Ask your health care provider about: The right schedule for you to have regular tests and exams. Things you can do on your own to prevent diseases and keep yourself healthy. What should I know about diet, weight, and exercise? Eat a healthy diet  Eat a diet that includes plenty of vegetables, fruits, low-fat dairy products, and lean protein. Do not eat a lot of foods that are high in solid fats, added sugars, or sodium. Maintain a healthy weight Body mass index (BMI) is used to identify weight problems. It estimates body fat based on height and weight. Your health care provider can help determine your BMI and help you achieve or maintain a healthy weight. Get regular exercise Get regular exercise. This is one of the most important things you can do for your health. Most adults should: Exercise for at least 150 minutes each week. The exercise should increase your heart rate and make you sweat (moderate-intensity exercise). Do strengthening exercises at least twice a week. This is in addition to the moderate-intensity exercise. Spend less time sitting. Even light physical activity can be beneficial. Watch cholesterol and  blood lipids Have your blood tested for lipids and cholesterol at 45 years of age, then have this test every 5 years. Have your cholesterol levels checked more often if: Your lipid or cholesterol levels are high. You are older than 45 years of age. You are at high risk for heart disease. What should I know about cancer screening? Depending on your health history and family history, you may need to have cancer screening at various ages. This may include screening for: Breast cancer. Cervical cancer. Colorectal cancer. Skin cancer. Lung cancer. What should I know about heart disease, diabetes, and high blood pressure? Blood pressure and heart disease High blood pressure causes heart disease and increases the risk of stroke. This is more likely to develop in people who have high blood pressure readings or are overweight. Have your blood pressure checked: Every 3-5 years if you are 39-75 years of age. Every year if you are 66 years old or older. Diabetes Have regular diabetes screenings. This checks your fasting blood sugar level. Have the screening done: Once every three years after age 44 if you are at a normal weight and have a low risk for diabetes. More often and at a younger age if you are overweight or have a high risk for diabetes. What should I know about preventing infection? Hepatitis B If you have a higher risk for hepatitis B, you should be screened for this virus. Talk with your health care provider to find out if you are at risk for hepatitis B infection. Hepatitis C Testing is recommended for: Everyone born from [birth year] through 1967. Anyone with known risk factors  for hepatitis C. Sexually transmitted infections (STIs) Get screened for STIs, including gonorrhea and chlamydia, if: You are sexually active and are younger than 45 years of age. You are older than 45 years of age and your health care provider tells you that you are at risk for this type of infection. Your sexual  activity has changed since you were last screened, and you are at increased risk for chlamydia or gonorrhea. Ask your health care provider if you are at risk. Ask your health care provider about whether you are at high risk for HIV. Your health care provider may recommend a prescription medicine to help prevent HIV infection. If you choose to take medicine to prevent HIV, you should first get tested for HIV. You should then be tested every 3 months for as long as you are taking the medicine. Pregnancy If you are about to stop having your period (premenopausal) and you may become pregnant, seek counseling before you get pregnant. Take 400 to 800 micrograms (mcg) of folic acid every day if you become pregnant. Ask for birth control (contraception) if you want to prevent pregnancy. Osteoporosis and menopause Osteoporosis is a disease in which the bones lose minerals and strength with aging. This can result in bone fractures. If you are 36 years old or older, or if you are at risk for osteoporosis and fractures, ask your health care provider if you should: Be screened for bone loss. Take a calcium  or vitamin D supplement to lower your risk of fractures. Be given hormone replacement therapy (HRT) to treat symptoms of menopause. Follow these instructions at home: Alcohol use Do not drink alcohol if: Your health care provider tells you not to drink. You are pregnant, may be pregnant, or are planning to become pregnant. If you drink alcohol: Limit how much you have to: 0-1 drink a day. Know how much alcohol is in your drink. In the [place] one drink equals one 12 oz bottle of beer (355 mL), one 5 oz glass of wine (148 mL), or one 1 oz glass of hard liquor (44 mL). Lifestyle Do not use any products that contain nicotine or tobacco. These products include cigarettes, chewing tobacco, and vaping devices, such as e-cigarettes. If you need help quitting, ask your health care provider. Do not use street  drugs. Do not share needles. Ask your health care provider for help if you need support or information about quitting drugs. General instructions Schedule regular health, dental, and eye exams. Stay current with your vaccines. Tell your health care provider if: You often feel depressed. You have ever been abused or do not feel safe at home. Summary Adopting a healthy lifestyle and getting preventive care are important in promoting health and wellness. Follow your health care provider's instructions about healthy diet, exercising, and getting tested or screened for diseases. Follow your health care provider's instructions on monitoring your cholesterol and blood pressure. This information is not intended to replace advice given to you by your health care provider. Make sure you discuss any questions you have with your health care provider. Document Revised: 01/04/2021 Document Reviewed: 01/04/2021 Elsevier Patient Education  2024 [employer].

## 2024-04-24 NOTE — Progress Notes (Signed)
 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 female with a PMHx significant for HTN, HLD , and Insomnia, who is here today for her routine physical.  Last CPE: 04/19/2023 Other Providers Gynecology - Dr. [name], who she last saw 10/25/2023 GI - Dr. Rosario Kidney, who she last saw 08/16/2023  Exercise: walking her dog daily and strength training twice weekly Diet: cooking at home, eating vegetables daily and adequate protein Sleep: 6 hours at most Alcohol Use: none Smoking: none Vision: UTD Dental: UTD  Health Maintenance  Topic Date Due   Pap with HPV screening  Never done   COVID-19 Vaccine (2 - 2024-25 season) 05/10/2024*   Flu Shot  11/26/2024*   Hepatitis B Vaccine (1 of 3 - 19+ 3-dose series) 04/24/2025*   HPV Vaccine (1 - 3-dose SCDM series) 04/24/2025*   HIV Screening  04/19/2028*   Colon Cancer Screening  04/25/2026   DTaP/Tdap/Td vaccine (2 - Td or Tdap) 06/06/2033   Hepatitis C Screening  Completed   Pneumococcal Vaccine  Aged Out   Meningitis B Vaccine  Aged Out  *Topic was postponed. The date shown is not the original due date.   Immunization History  Administered Date(s) Administered   Influenza, Seasonal, Injecte, Preservative Fre 06/07/2023   Influenza,inj,Quad PF,6+ Mos 06/13/2022   Influenza-Unspecified 05/20/2021   PFIZER(Purple Top)SARS-COV-2 Vaccination 07/06/2020   Tdap 06/07/2023   Chronic medical problems:   Hypertension:  Medications: Amlodipine  10 mg daily.  She is on propranolol  20 mg as needed for residual anxiety, she does not take medication frequently. Currently she is also on minoxidil  1.25 mg daily, which has greatly helped with hair thickness. She has not no side effect from medications.  BP Readings from Last 3 Encounters:  04/24/24 110/72  04/02/24 (!) 120/92  04/01/24 125/84   Lab Results  Component Value Date   CREATININE 0.86 04/01/2024   BUN 11  04/01/2024   NA 137 04/01/2024   K 3.4 (L) 04/01/2024   CL 100 04/01/2024   CO2 26 04/01/2024   Hyperlipidemia: Currently on Rosuvastatin  10 mg daily. Lab Results  Component Value Date   CHOL 166 07/05/2023   HDL 72.10 07/05/2023   LDLCALC 85 07/05/2023   TRIG 45.0 07/05/2023   CHOLHDL 2 07/05/2023   Insomnia managed with Trazodone  25-50 mg as needed, which she says she'll take 25 mg at night, and this normally is enough but she will occasionally wake up at 3 AM, so will take another 25 mg.  She does not take 50 mg whole as she will wake up drowsy if she does.   Seen 04/01/2024 in both UC and then ED for RLQ abdominal pain.  04/01/2024 Abdominal/Pelvis CT noted: Large 13.7 x 12.7 cm fibroid compressing dome of bladder. 3.0 cm soft tissue nodule presumably arising from right adnexa, possibly representing a dermoid.  04/02/2024 Pelvic US  to r/o torsion found: Large mass 13.6 cm occupying uterine body and fundus, obstructing view of endometrium or IUD  Complex right ovarian cyst measuring up to 3.7 cm.  She denies having any heavy vaginal bleeding associated with fibroids, but does have abdominal discomfort; her last menstrual period was 03/17/2024. Says that she plans on undergoing a hysterectomy in 05/2025.  Review of Systems  Constitutional:  Negative for activity change, appetite change and fever.  HENT:  Negative for mouth sores, sore throat and trouble swallowing.   Eyes:  Negative for redness  and visual disturbance.  Respiratory:  Negative for cough, shortness of breath and wheezing.   Cardiovascular:  Negative for chest pain and leg swelling.  Gastrointestinal:  Positive for abdominal pain (due to fibroid). Negative for nausea and vomiting.  Endocrine: Negative for cold intolerance, heat intolerance, polydipsia, polyphagia and polyuria.  Genitourinary:  Positive for pelvic pain (discomfort). Negative for decreased urine volume, dysuria and hematuria.  Musculoskeletal:   Negative for gait problem and myalgias.  Skin:  Negative for color change and rash.  Allergic/Immunologic: Negative for environmental allergies.  Neurological:  Negative for syncope, weakness and headaches.  Hematological:  Negative for adenopathy. Does not bruise/bleed easily.  Psychiatric/Behavioral:  Positive for sleep disturbance. Negative for confusion.   All other systems reviewed and are negative.  Current Outpatient Medications on File Prior to Visit  Medication Sig Dispense Refill   amLODipine  (NORVASC ) 10 MG tablet Take 1 tablet (10 mg total) by mouth daily. 90 tablet 2   minoxidil  (LONITEN ) 2.5 MG tablet Take 1/2 tablets (1.25 mg total) by mouth daily. 90 tablet 2   propranolol  (INDERAL ) 20 MG tablet Take 1 tablet (20 mg total) by mouth daily as needed. 90 tablet 3   rosuvastatin  (CRESTOR ) 10 MG tablet Take 1 tablet (10 mg total) by mouth daily. 90 tablet 3   traZODone  (DESYREL ) 50 MG tablet Take 0.5-1 tablets (25-50 mg total) by mouth at bedtime as needed for sleep. 30 tablet 1   No current facility-administered medications on file prior to visit.   Past Medical History:  Diagnosis Date   Anxiety    hx of   Arrhythmia 04/2021   Constipation    Heart murmur    As a child. Echo done. Normal.   Hyperlipidemia    diet controlled   Hyperlipidemia    Hypertension    on meds   Lynch syndrome    Positive TB test    cleared by cxr per patient   Past Surgical History:  Procedure Laterality Date   EYE SURGERY  1983   WISDOM TOOTH EXTRACTION  2015   No Known Allergies  Family History  Problem Relation Age of Onset   Diabetes Mother    Hyperlipidemia Mother    Miscarriages / Stillbirths Mother    Graves' disease Mother    Hypertension Mother    Colon polyps Father 94   Hyperlipidemia Father    Arthritis Father    Hypertension Father    Hyperlipidemia Sister    Hypertension Sister    Breast cancer Paternal Aunt 59 - 32   Alcohol abuse Maternal Grandmother     Arthritis Paternal Grandmother    Breast cancer Paternal Grandmother 4 - 67   Hypertension Paternal Grandfather    Uterine cancer Cousin        paternal half-cousin   Ovarian cancer Other 66 - 61       maternal half-aunt   Colon cancer Neg Hx    Rectal cancer Neg Hx    Stomach cancer Neg Hx    Esophageal cancer Neg Hx     Social History   Socioeconomic History   Marital status: Married    Spouse name: Not on file   Number of children: 2   Years of education: Not on file   Highest education level: Professional school degree (e.g., MD, DDS, DVM, JD)  Occupational History   Occupation: Pediatrician  Tobacco Use   Smoking status: Never   Smokeless tobacco: Never  Vaping Use   Vaping status:  Never Used  Substance and Sexual Activity   Alcohol use: Yes    Comment: occ   Drug use: No   Sexual activity: Not on file  Other Topics Concern   Not on file  Social History Narrative   Not on file   Social Drivers of Health   Financial Resource Strain: Low Risk  (04/24/2024)   Overall Financial Resource Strain (CARDIA)    Difficulty of Paying Living Expenses: Not hard at all  Food Insecurity: No Food Insecurity (04/24/2024)   Hunger Vital Sign    Worried About Running Out of Food in the Last Year: Never true    Ran Out of Food in the Last Year: Never true  Transportation Needs: No Transportation Needs (04/24/2024)   PRAPARE - Administrator, Civil Service (Medical): No    Lack of Transportation (Non-Medical): No  Physical Activity: Insufficiently Active (04/24/2024)   Exercise Vital Sign    Days of Exercise per Week: 4 days    Minutes of Exercise per Session: 30 min  Stress: No Stress Concern Present (04/24/2024)   [employer] of Occupational Health - Occupational Stress Questionnaire    Feeling of Stress: Not at all  Social Connections: Moderately Integrated (04/24/2024)   Social Connection and Isolation Panel    Frequency of Communication with Friends and  Family: More than three times a week    Frequency of Social Gatherings with Friends and Family: Once a week    Attends Religious Services: Never    Database administrator or Organizations: Yes    Attends Banker Meetings: More than 4 times per year    Marital Status: Married   Vitals:   04/24/24 0951  BP: 110/72  Pulse: 88  Resp: 12  SpO2: 99%   Body mass index is 23.49 kg/m.  Wt Readings from Last 3 Encounters:  04/24/24 154 lb 8 oz (70.1 kg)  04/01/24 155 lb (70.3 kg)  08/16/23 155 lb (70.3 kg)   Physical Exam Vitals and nursing note reviewed.  Constitutional:      General: She is not in acute distress.    Appearance: She is well-developed.  HENT:     Head: Normocephalic and atraumatic.     Right Ear: External ear normal.     Left Ear: Hearing, tympanic membrane, ear canal and external ear normal.     Ears:     Comments: Right ear canal cerumen excess, could not see TM    Mouth/Throat:     Mouth: Mucous membranes are moist.     Pharynx: Oropharynx is clear. Uvula midline.  Eyes:     Extraocular Movements: Extraocular movements intact.     Conjunctiva/sclera: Conjunctivae normal.     Pupils: Pupils are equal, round, and reactive to light.  Neck:     Thyroid : No thyroid  mass or thyromegaly.  Cardiovascular:     Rate and Rhythm: Normal rate and regular rhythm.     Pulses:          Dorsalis pedis pulses are 2+ on the right side and 2+ on the left side.     Heart sounds: No murmur heard. Pulmonary:     Effort: Pulmonary effort is normal. No respiratory distress.     Breath sounds: Normal breath sounds.  Abdominal:     Palpations: Abdomen is soft. There is no hepatomegaly.     Tenderness: There is no abdominal tenderness.     Comments: Enlarged uterus due to fibroids palpable  in the suprapubic area.  Genitourinary:    Comments: Deferred to gyn. Musculoskeletal:     Right lower leg: No edema.     Left lower leg: No edema.     Comments: No major  deformity or signs of synovitis appreciated.  Lymphadenopathy:     Cervical: No cervical adenopathy.     Upper Body:     Right upper body: No supraclavicular adenopathy.     Left upper body: No supraclavicular adenopathy.  Skin:    General: Skin is warm.     Findings: No erythema or rash.  Neurological:     General: No focal deficit present.     Mental Status: She is alert and oriented to person, place, and time.     Cranial Nerves: No cranial nerve deficit.     Coordination: Coordination normal.     Gait: Gait normal.     Deep Tendon Reflexes:     Reflex Scores:      Bicep reflexes are 2+ on the right side and 2+ on the left side.      Patellar reflexes are 2+ on the right side and 2+ on the left side. Psychiatric:        Mood and Affect: Mood and affect normal.    ASSESSMENT AND PLAN:  Desiree Campbell was seen here today for her annual physical examination.  Routine general medical examination at a health care facility Assessment & Plan: We discussed the importance of regular physical activity and healthy diet for prevention of chronic illness and/or complications. Preventive guidelines reviewed. Vaccination up-to-date. Continue female preventive care with gynecologist. Next CPE in a year.   Hyperlipidemia, unspecified hyperlipidemia type Assessment & Plan: Problem has improved with rosuvastatin  10 mg daily. She prefers to hold on blood work at this time, we will plan on fasting lipid panel next year.   Insomnia, unspecified type Assessment & Plan: Stable. Continue trazodone  50 mg 1/2 to 1 tablet at bedtime as needed and adequate sleep hygiene.   Hair thinning Assessment & Plan: Minoxidil  1.25 mg daily has greatly helped, she would like to continue it. She understands side effects.   Hypertension, essential, benign Assessment & Plan: Problem is adequately controlled. Continue amlodipine  10 mg daily and low-salt diet. Monitor BP at  home. Follow-up in a year, before if needed    Return in 1 year (on 04/24/2025) for CPE, chronic problems.  I,Emily Lagle,acting as a Neurosurgeon for Berda Shelvin [place], MD.,have documented all relevant documentation on the behalf of Gahel Safley [place], MD,as directed by  Geoffery Aultman [place], MD while in the presence of Voncile Schwarz [place], MD.  I, Jahbari Repinski [place], MD, have reviewed all documentation for this visit. The documentation on 04/24/24 for the exam, diagnosis, procedures, and orders are all accurate and complete.  Olar Santini G. [place], MD  [facility]. [place] office.

## 2024-05-02 DIAGNOSIS — D219 Benign neoplasm of connective and other soft tissue, unspecified: Secondary | ICD-10-CM | POA: Diagnosis not present

## 2024-05-02 DIAGNOSIS — N3946 Mixed incontinence: Secondary | ICD-10-CM | POA: Diagnosis not present

## 2024-05-02 DIAGNOSIS — Z87898 Personal history of other specified conditions: Secondary | ICD-10-CM | POA: Diagnosis not present

## 2024-05-03 ENCOUNTER — Other Ambulatory Visit: Payer: Self-pay | Admitting: Obstetrics and Gynecology

## 2024-05-16 DIAGNOSIS — R32 Unspecified urinary incontinence: Secondary | ICD-10-CM | POA: Diagnosis not present

## 2024-05-22 ENCOUNTER — Other Ambulatory Visit (HOSPITAL_COMMUNITY): Payer: Self-pay

## 2024-05-29 ENCOUNTER — Other Ambulatory Visit (HOSPITAL_COMMUNITY): Payer: Self-pay

## 2024-05-29 DIAGNOSIS — N319 Neuromuscular dysfunction of bladder, unspecified: Secondary | ICD-10-CM | POA: Diagnosis not present

## 2024-05-29 DIAGNOSIS — N898 Other specified noninflammatory disorders of vagina: Secondary | ICD-10-CM | POA: Diagnosis not present

## 2024-05-29 DIAGNOSIS — Z01818 Encounter for other preprocedural examination: Secondary | ICD-10-CM | POA: Diagnosis not present

## 2024-05-29 MED ORDER — SOLIFENACIN SUCCINATE 10 MG PO TABS
10.0000 mg | ORAL_TABLET | Freq: Every day | ORAL | 3 refills | Status: DC
  Filled 2024-05-29: qty 30, 30d supply, fill #0

## 2024-06-06 ENCOUNTER — Ambulatory Visit (HOSPITAL_BASED_OUTPATIENT_CLINIC_OR_DEPARTMENT_OTHER): Admitting: Pulmonary Disease

## 2024-06-06 ENCOUNTER — Encounter (HOSPITAL_BASED_OUTPATIENT_CLINIC_OR_DEPARTMENT_OTHER): Payer: Self-pay | Admitting: Pulmonary Disease

## 2024-06-06 VITALS — BP 140/88 | HR 75 | Ht 68.0 in | Wt 154.2 lb

## 2024-06-06 DIAGNOSIS — R0683 Snoring: Secondary | ICD-10-CM | POA: Diagnosis not present

## 2024-06-06 NOTE — Patient Instructions (Addendum)
   VISIT SUMMARY: Desiree Campbell, a 43 year old female, visited the clinic due to concerns about worsening snoring and possible obstructive sleep apnea. She has tried various methods to alleviate the snoring without success. She does not experience daytime fatigue or other symptoms of sleep apnea. She has a family history of obstructive sleep apnea. Her current medications include propranolol , minoxidil , and amlodipine . She is scheduled for fibroid removal in two weeks.  YOUR PLAN: -SNORING: Snoring is a common condition where the flow of air through the mouth and nose is partially obstructed during sleep, causing the tissues to vibrate and create noise. To help with your snoring, try using nasal dilators, which are available over the counter. You can also consider behavioral modifications such as positional therapy, including using a tennis ball to avoid sleeping on your back. Discuss with your husband the use of earplugs or white noise to manage the impact of your snoring. If the snoring persists and becomes problematic, we may consider a home sleep study to rule out sleep apnea.  INSTRUCTIONS: Try nasal dilators and positional therapy to help with snoring. Discuss with your husband the use of earplugs or white noise. If snoring persists, consider a home sleep study to rule out sleep apnea.                      Contains text generated by [employer]bridge.                                 Contains text generated by Abridge.

## 2024-06-06 NOTE — Progress Notes (Signed)
 New Patient Pulmonology Office Visit   Subjective:  Patient ID: Desiree Campbell, female    DOB: 1980/09/28  MRN: [id number]  Referred by: [place], Betty G, MD  CC:  Chief Complaint  Patient presents with   Consult    Sleep     Discussed the use of AI scribe software for clinical note transcription with the patient, who gave verbal consent to proceed.  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  She experiences worsening snoring, described as very loud with a 'popping sound' during sleep. Despite this, she does not have daytime fatigue, gasping, or waking up coughing. She has tried Flonase without improvement and used SnoreLab, which confirmed severe snoring. There is a family history of obstructive sleep apnea in her mother and sister.  She has episodic insomnia, previously treated with Ambien , now managed with trazodone , with no significant insomnia for ten months. She is perimenopausal and attributes some symptoms to this. She sleeps on her side or stomach due to fibroids, which are scheduled for removal in two weeks. She wakes up around 7 AM without headaches or dry mouth. Frequent urination is attributed to fibroids.  Current medications include propranolol  for anxiety, minoxidil  for hair loss, and amlodipine  for hypertension, with blood pressure usually well-controlled.  PMH : Htn  Hysterectomy planned in 2 weeks   ESS 0  ROS  Constitutional: negative for anorexia, fevers and sweats  Eyes: negative for irritation, redness and visual disturbance  Ears, nose, mouth, throat, and face: negative for earaches, epistaxis, nasal congestion and sore throat  Respiratory: negative for cough, dyspnea on exertion, sputum and wheezing  Cardiovascular: negative for chest pain, dyspnea, lower extremity edema, orthopnea, palpitations and syncope   Gastrointestinal: negative for abdominal pain, constipation, diarrhea, melena, nausea and vomiting  Genitourinary:negative for dysuria, frequency and hematuria  Hematologic/lymphatic: negative for bleeding, easy bruising and lymphadenopathy  Musculoskeletal:negative for arthralgias, muscle weakness and stiff joints  Neurological: negative for coordination problems, gait problems, headaches and weakness  Endocrine: negative for diabetic symptoms including polydipsia, polyuria and weight loss   Allergies: Patient has no known allergies.  Current Outpatient Medications:    amLODipine  (NORVASC ) 10 MG tablet, Take 1 tablet (10 mg total) by mouth daily., Disp: 90 tablet, Rfl: 2   minoxidil  (LONITEN ) 2.5 MG tablet, Take 1/2 tablets (1.25 mg total) by mouth daily., Disp: 90 tablet, Rfl: 2   propranolol  (INDERAL ) 20 MG tablet, Take 1 tablet (20 mg total) by mouth daily as needed., Disp: 90 tablet, Rfl: 3   rosuvastatin  (CRESTOR ) 10 MG tablet, Take 1 tablet (10 mg total) by mouth daily., Disp: 90 tablet, Rfl: 3   solifenacin (VESICARE) 10 MG tablet, Take 1 tablet (10 mg total) by mouth daily., Disp: 30 tablet, Rfl: 3   traZODone  (DESYREL ) 50 MG tablet, Take 0.5-1 tablets (25-50 mg total) by mouth at bedtime as needed for sleep., Disp: 30 tablet, Rfl: 1 Past Medical History:  Diagnosis Date   Anxiety    hx of   Arrhythmia 04/2021   Constipation    Heart murmur    As a child. Echo done. Normal.   Hyperlipidemia    diet controlled   Hyperlipidemia    Hypertension    on meds   Lynch syndrome    Positive TB test    cleared by cxr per patient   Past Surgical History:  Procedure Laterality Date  EYE SURGERY  1983   WISDOM TOOTH EXTRACTION  2015   Family History  Problem Relation Age of Onset   Diabetes Mother    Hyperlipidemia Mother    Miscarriages / Stillbirths Mother    Graves' disease Mother    Hypertension Mother    Colon polyps Father 69   Hyperlipidemia Father    Arthritis  Father    Hypertension Father    Hyperlipidemia Sister    Hypertension Sister    Breast cancer Paternal Aunt 45 - 26   Alcohol abuse Maternal Grandmother    Arthritis Paternal Grandmother    Breast cancer Paternal Grandmother 22 - 67   Hypertension Paternal Grandfather    Uterine cancer Cousin        paternal half-cousin   Ovarian cancer Other 78 - 36       maternal half-aunt   Colon cancer Neg Hx    Rectal cancer Neg Hx    Stomach cancer Neg Hx    Esophageal cancer Neg Hx    Social History   Socioeconomic History   Marital status: Married    Spouse name: Not on file   Number of children: 2   Years of education: Not on file   Highest education level: Professional school degree (e.g., MD, DDS, DVM, JD)  Occupational History   Occupation: Pediatrician  Tobacco Use   Smoking status: Never   Smokeless tobacco: Never  Vaping Use   Vaping status: Never Used  Substance and Sexual Activity   Alcohol use: Yes    Comment: occ   Drug use: No   Sexual activity: Not on file  Other Topics Concern   Not on file  Social History Narrative   Not on file   Social Drivers of Health   Financial Resource Strain: Low Risk  (04/24/2024)   Overall Financial Resource Strain (CARDIA)    Difficulty of Paying Living Expenses: Not hard at all  Food Insecurity: No Food Insecurity (04/24/2024)   Hunger Vital Sign    Worried About Running Out of Food in the Last Year: Never true    Ran Out of Food in the Last Year: Never true  Transportation Needs: No Transportation Needs (04/24/2024)   PRAPARE - Administrator, Civil Service (Medical): No    Lack of Transportation (Non-Medical): No  Physical Activity: Insufficiently Active (04/24/2024)   Exercise Vital Sign    Days of Exercise per Week: 4 days    Minutes of Exercise per Session: 30 min  Stress: No Stress Concern Present (04/24/2024)   [employer] of Occupational Health - Occupational Stress Questionnaire    Feeling of  Stress: Not at all  Social Connections: Moderately Integrated (04/24/2024)   Social Connection and Isolation Panel    Frequency of Communication with Friends and Family: More than three times a week    Frequency of Social Gatherings with Friends and Family: Once a week    Attends Religious Services: Never    Database administrator or Organizations: Yes    Attends Engineer, structural: More than 4 times per year    Marital Status: Married  Catering manager Violence: Not on file       Objective:  BP (!) 140/88   Pulse 75   Ht 5' 8 (1.727 m)   Wt 154 lb 3.2 oz (69.9 kg)   SpO2 100%   BMI 23.45 kg/m    Physical Exam  Gen. Pleasant, well-nourished, in no distress ENT -  narrow turbinates, no pallor/icterus,no post nasal drip, 2mm overbite  Neck: No JVD, no thyromegaly, no carotid bruits Lungs: no use of accessory muscles, no dullness to percussion, clear without rales or rhonchi  Cardiovascular: Rhythm regular, heart sounds  normal, no murmurs or gallops, no peripheral edema Musculoskeletal: No deformities, no cyanosis or clubbing      Assessment & Plan:  Assessment and Plan Assessment & Plan Snoring She reports worsening snoring, noted by her husband to be very loud. There is no associated daytime fatigue, sleepiness, or symptoms of obstructive sleep apnea (OSA) such as gasping or choking during sleep. The snoring is not seasonal, and nasal steroids have been ineffective. Anatomical examination did not reveal any obvious causes such as enlarged tonsils or significant nasal obstruction. The snoring may be related to her body position during sleep, as she sleeps on her side and stomach due to fibroids. Simple snoring can be challenging to treat and is often more difficult to address than OSA. She is not experiencing insomnia, and her sleep quality has improved with the use of white noise headphones. - Try nasal dilators, available over the counter, to help with snoring. -  Consider behavioral modifications such as positional therapy, including using a tennis ball to avoid sleeping on her back. - Discuss with her husband the use of earplugs or white noise to manage the impact of snoring. - If snoring persists and becomes problematic, consider a home sleep study to rule out sleep apnea.       No follow-ups on file.   Harden ROCKFORD Jude, MD

## 2024-06-10 DIAGNOSIS — N319 Neuromuscular dysfunction of bladder, unspecified: Secondary | ICD-10-CM | POA: Diagnosis not present

## 2024-06-10 DIAGNOSIS — D219 Benign neoplasm of connective and other soft tissue, unspecified: Secondary | ICD-10-CM | POA: Diagnosis not present

## 2024-06-10 DIAGNOSIS — N8189 Other female genital prolapse: Secondary | ICD-10-CM | POA: Diagnosis not present

## 2024-06-14 ENCOUNTER — Encounter (HOSPITAL_COMMUNITY): Payer: Self-pay | Admitting: Obstetrics and Gynecology

## 2024-06-17 ENCOUNTER — Encounter (HOSPITAL_COMMUNITY): Payer: Self-pay | Admitting: Obstetrics and Gynecology

## 2024-06-17 NOTE — Pre-Procedure Instructions (Signed)
 Surgical Instructions  Your procedure is scheduled on :  Friday,  06-21-2024 Report to [facility] Main Entrance A at 8:00 AM, then check in the Admitting office. Any questions or running late day of surgery :  call [phone]  Questions prior to your surgery day:  call [phone], Monday -- Friday 8am - 4pm. If you experience any cold or flu symptoms such as cough, fever, chills, shortness of breath, etc. between now and you scheduled surgery, please notify your surgeon office.   Remember: Do not eat any food after midnight the night before surgery. You may have clear liquids from midnight night before surgery until 7:00 AM.   Clear liquids allowed are:  Water             Carbonated Beverages (diabetics choose diet or no sugar options)  Clear Tea ( no milk, no honey, etc.)  Black coffee ( NO MILK, CREAM OR POWDERED CREAMER OF ANY KIND)  Sport drinks, like Gatorade (diabetes choose diet or no sugar options)  NO clear liquid after 7:00 AM day of surgery.  This includes No water,  candy,  gum, and mints.  Take these medicines the morning of surgery with A SIPS OF WATER: Amlodipine  (norvasc ) Rosuvastatin  (crestor )     May take these medicines IF NEEDED:   Propranolol     One week prior to surgery, STOP taking any Aspirin (unless otherwise instructed by your surgeon) Aleve, Naproxen, ibuprofen, Motrin, Advil, Goody's, BC's, all herbal medications/ supplements, fish oil, and non-prescription vitamins.  Do NOT Smoke (tobacco/ vaping) and Do Not drink alcohol for 24 hours prior to your procedure.  For those patients that use a CPAP.  Please bring your CPAP/ mask/ tubing with them day of surgery . Anesthesia may ask recovery room nurse to use and if you stay the night you be asked to use it.  You will be asked to removed any contacts, glasses, piercing's, hearing aid's, dentures/ partials prior to surgery.  Please bring cases/ container/ solution/ etc., for them day of surgery.    Patients discharged the day of surgery will NOT be allowed to drive home.  You must have responsible driver and caregiver to stay at home with you the next 24 hours.  SURGICAL WAITING ROOM VISITATION Patients may have no more than 2 support people in the waiting area - if more than 2 , these visitors may rotate.  Pre-op nurse will coordinate an appropriate time for 1 Adult support person, who may not rotate, to accompany patient in pre-op.  Aware some patients may have certain circumstances, speak to pre-op nurse day of surgery.  Children under the age 60 must have an adult with them who is not the patient and must remain in the main waiting area with an adult.  If the patient needs to stay at the hospital during part of their recovery, the visitor guidelines for inpatient rooms apply.  Please refer to the [facility] website for the visitor guidelines for any additional information.  If you received a COVID test during your pre-op visit it is requested that you wear a mask when out in public, stay away from anyone that may not be feeling well and notify your surgeon if you develop symptoms.  If you have been in contact with anyone that has tested positive in the past 10 days notify your surgeon.     [facility] - Preparing for Surgery  Before surgery, you can play an important role. Because skin is not sterile,  it needs to be as free of germs as possible. You can reduce the number of germs on your skin by washing with CHG (chlorhexidine gluconate) soap before surgery. CHG is an antiseptic cleaner which kills germs and bonds with the skin to continue killing germs even after washing. Oral hygiene is also important in reducing the risk of infection. Remember to brush your teeth with your regular toothpaste the morning of surgery.  Please DO NOT use if you have an allergy to CHG or antibacterial soaps. If your skin becomes reddened/irritated stop using the CHG and inform your Pre-op nurse day of  surgery.  DO NOT shave (including legs and genital area) for at least 48 hours prior to your CHG shower.   Please follow these instructions carefully:  Shower with CHG soap the night before surgery. If you choose to wash your hair, wash your hair first as usual with your normal shampoo. After you shampoo, rinse your hair and body thoroughly to remove the shampoo. Use CHG as you would any other liquid soap. You can apply CHG directly to the skin and wash gently with a clean washcloth or shower sponge. Apply the CHG soap to your body ONLY FROM THE NECK DOWN. Do not use on open wounds or open sores. Avoid contact with your eyes, ears, mouth, and genitals (private parts). Wash genitals (private parts) with your normal soap. Wash thoroughly, paying special attention to the area where your surgery will be performed. Thoroughly rinse your body with warm water from the neck down. DO NOT shower/wash with your normal soap after using and rinsing off the CHG soap. DO NOT use lotions, oils, etc., after showering with CHG. Pat yourself dry with a clean towel. Wear clean pajamas. Place clean sheets on your bed the night of your CHG shower and do not sleep with pets.  Day of Surgery  DO NOT Apply any lotions,  powder,  oils,  deodorants (may use underarm deodorant),  cologne/  perfumes  or makeup Do Not wear jewelry /  piercing's/  metal/  permanent jewelry must be removed prior to arrival day of surgery. (No plastic piercing) Do Not wear nail polish,  gel polish,  artificial nails, or any other type of covering on natural finger nails (toe nails are okay) Remember to brush your teeth and rinse mouth out. Put on clean / comfortable clothes. [facility] is not responsible for valuables/ personal belongings

## 2024-06-17 NOTE — Progress Notes (Signed)
 Spoke w/ via phone for pre-op interview--- pt Lab needs dos---- upt        Lab results------ lab appt 06-20-2024 @ getting CBC/ BMP/ T&S/ EKG COVID test -----patient states asymptomatic no test needed Arrive at ------- 0800 on 06-21-2024  NPO after MN NO Solid Food.  Clear liquids from MN until--- 0700 Pre-Surgery Ensure or G2: n/a  Med rec completed Medications to take morning of surgery ----- norvasc , crestor , / if needed propranolol  Diabetic medication ----- n/a  GLP1 agonist last dose: n/a GLP1 instructions:  Patient instructed no nail polish to be worn day of surgery Patient instructed to bring photo id and insurance card day of surgery Patient aware to have Driver (ride ) / caregiver    for 24 hours after surgery - husband, Desiree Campbell Patient Special Instructions ----- will pick up soap and written instructions at lab appt Pre-Op special Instructions ----- n/a  Patient verbalized understanding of instructions that were given at this phone interview. Patient denies chest pain, sob, fever, cough at the interview.

## 2024-06-20 ENCOUNTER — Other Ambulatory Visit: Payer: Self-pay

## 2024-06-20 ENCOUNTER — Encounter (HOSPITAL_COMMUNITY)
Admission: RE | Admit: 2024-06-20 | Discharge: 2024-06-20 | Disposition: A | Discharge status: Home / Self Care | Source: Ambulatory Visit | Attending: Obstetrics and Gynecology | Admitting: Obstetrics and Gynecology

## 2024-06-20 ENCOUNTER — Ambulatory Visit (HOSPITAL_COMMUNITY): Admit: 2024-06-20 | Admitting: Obstetrics and Gynecology

## 2024-06-20 DIAGNOSIS — Z803 Family history of malignant neoplasm of breast: Secondary | ICD-10-CM | POA: Diagnosis not present

## 2024-06-20 DIAGNOSIS — D259 Leiomyoma of uterus, unspecified: Secondary | ICD-10-CM | POA: Diagnosis not present

## 2024-06-20 DIAGNOSIS — I10 Essential (primary) hypertension: Secondary | ICD-10-CM | POA: Diagnosis not present

## 2024-06-20 DIAGNOSIS — Z15060 Genetic susceptibility to colorectal cancer: Secondary | ICD-10-CM | POA: Diagnosis not present

## 2024-06-20 DIAGNOSIS — Z01818 Encounter for other preprocedural examination: Secondary | ICD-10-CM | POA: Insufficient documentation

## 2024-06-20 DIAGNOSIS — E785 Hyperlipidemia, unspecified: Secondary | ICD-10-CM | POA: Diagnosis not present

## 2024-06-20 DIAGNOSIS — N946 Dysmenorrhea, unspecified: Secondary | ICD-10-CM | POA: Diagnosis not present

## 2024-06-20 DIAGNOSIS — Z833 Family history of diabetes mellitus: Secondary | ICD-10-CM | POA: Diagnosis not present

## 2024-06-20 DIAGNOSIS — Z8249 Family history of ischemic heart disease and other diseases of the circulatory system: Secondary | ICD-10-CM | POA: Diagnosis not present

## 2024-06-20 DIAGNOSIS — N920 Excessive and frequent menstruation with regular cycle: Secondary | ICD-10-CM | POA: Diagnosis not present

## 2024-06-20 DIAGNOSIS — Z8041 Family history of malignant neoplasm of ovary: Secondary | ICD-10-CM | POA: Diagnosis not present

## 2024-06-20 LAB — CBC
HCT: 44.7 % (ref 36.0–46.0)
Hemoglobin: 15 g/dL (ref 12.0–15.0)
MCH: 29.9 pg (ref 26.0–34.0)
MCHC: 33.6 g/dL (ref 30.0–36.0)
MCV: 89 fL (ref 80.0–100.0)
Platelets: 290 K/uL (ref 150–400)
RBC: 5.02 MIL/uL (ref 3.87–5.11)
RDW: 12.9 % (ref 11.5–15.5)
WBC: 5 K/uL (ref 4.0–10.5)
nRBC: 0 % (ref 0.0–0.2)

## 2024-06-20 LAB — BASIC METABOLIC PANEL WITH GFR
Anion gap: 11 (ref 5–15)
BUN: 6 mg/dL (ref 6–20)
CO2: 26 mmol/L (ref 22–32)
Calcium: 9.6 mg/dL (ref 8.9–10.3)
Chloride: 100 mmol/L (ref 98–111)
Creatinine, Ser: 0.82 mg/dL (ref 0.44–1.00)
GFR, Estimated: >60 mL/min (ref >60)
Glucose, Bld: 86 mg/dL (ref 70–99)
Potassium: 4 mmol/L (ref 3.5–5.1)
Sodium: 137 mmol/L (ref 135–145)

## 2024-06-20 LAB — TYPE AND SCREEN
ABO/RH(D): O POS
Antibody Screen: NEGATIVE

## 2024-06-20 SURGERY — HYSTERECTOMY, VAGINAL, LAPAROSCOPY-ASSISTED, WITH SALPINGECTOMY
Anesthesia: General

## 2024-06-21 ENCOUNTER — Ambulatory Visit (HOSPITAL_COMMUNITY): Payer: Self-pay | Admitting: Registered Nurse

## 2024-06-21 ENCOUNTER — Inpatient Hospital Stay (HOSPITAL_COMMUNITY)
Admission: RE | Admit: 2024-06-21 | Discharge: 2024-06-23 | DRG: 743 | Disposition: A | Discharge status: Home / Self Care | Attending: Obstetrics and Gynecology | Admitting: Obstetrics and Gynecology

## 2024-06-21 ENCOUNTER — Encounter (HOSPITAL_COMMUNITY)
Admission: RE | Disposition: A | Discharge status: Home / Self Care | Payer: Self-pay | Source: Home / Self Care | Attending: Obstetrics and Gynecology

## 2024-06-21 ENCOUNTER — Encounter (HOSPITAL_COMMUNITY): Payer: Self-pay | Admitting: Registered Nurse

## 2024-06-21 ENCOUNTER — Other Ambulatory Visit: Payer: Self-pay

## 2024-06-21 ENCOUNTER — Encounter (HOSPITAL_COMMUNITY): Payer: Self-pay | Admitting: Obstetrics and Gynecology

## 2024-06-21 DIAGNOSIS — Z79899 Other long term (current) drug therapy: Secondary | ICD-10-CM

## 2024-06-21 DIAGNOSIS — N946 Dysmenorrhea, unspecified: Secondary | ICD-10-CM | POA: Diagnosis present

## 2024-06-21 DIAGNOSIS — N888 Other specified noninflammatory disorders of cervix uteri: Secondary | ICD-10-CM | POA: Diagnosis not present

## 2024-06-21 DIAGNOSIS — Z83438 Family history of other disorder of lipoprotein metabolism and other lipidemia: Secondary | ICD-10-CM | POA: Diagnosis not present

## 2024-06-21 DIAGNOSIS — I10 Essential (primary) hypertension: Secondary | ICD-10-CM | POA: Diagnosis not present

## 2024-06-21 DIAGNOSIS — Z803 Family history of malignant neoplasm of breast: Secondary | ICD-10-CM | POA: Diagnosis not present

## 2024-06-21 DIAGNOSIS — E785 Hyperlipidemia, unspecified: Secondary | ICD-10-CM | POA: Diagnosis not present

## 2024-06-21 DIAGNOSIS — Z8261 Family history of arthritis: Secondary | ICD-10-CM | POA: Diagnosis not present

## 2024-06-21 DIAGNOSIS — Z538 Procedure and treatment not carried out for other reasons: Secondary | ICD-10-CM

## 2024-06-21 DIAGNOSIS — D259 Leiomyoma of uterus, unspecified: Secondary | ICD-10-CM | POA: Diagnosis not present

## 2024-06-21 DIAGNOSIS — Z83719 Family history of colon polyps, unspecified: Secondary | ICD-10-CM

## 2024-06-21 DIAGNOSIS — Z15060 Genetic susceptibility to colorectal cancer: Secondary | ICD-10-CM

## 2024-06-21 DIAGNOSIS — N921 Excessive and frequent menstruation with irregular cycle: Secondary | ICD-10-CM | POA: Diagnosis not present

## 2024-06-21 DIAGNOSIS — N920 Excessive and frequent menstruation with regular cycle: Secondary | ICD-10-CM | POA: Diagnosis present

## 2024-06-21 DIAGNOSIS — Z8049 Family history of malignant neoplasm of other genital organs: Secondary | ICD-10-CM | POA: Diagnosis not present

## 2024-06-21 DIAGNOSIS — N83209 Unspecified ovarian cyst, unspecified side: Secondary | ICD-10-CM | POA: Diagnosis not present

## 2024-06-21 DIAGNOSIS — N83201 Unspecified ovarian cyst, right side: Secondary | ICD-10-CM | POA: Diagnosis present

## 2024-06-21 DIAGNOSIS — Z8249 Family history of ischemic heart disease and other diseases of the circulatory system: Secondary | ICD-10-CM

## 2024-06-21 DIAGNOSIS — Z90710 Acquired absence of both cervix and uterus: Secondary | ICD-10-CM | POA: Diagnosis present

## 2024-06-21 DIAGNOSIS — F419 Anxiety disorder, unspecified: Secondary | ICD-10-CM | POA: Diagnosis present

## 2024-06-21 DIAGNOSIS — D270 Benign neoplasm of right ovary: Secondary | ICD-10-CM | POA: Diagnosis not present

## 2024-06-21 DIAGNOSIS — Z01818 Encounter for other preprocedural examination: Principal | ICD-10-CM

## 2024-06-21 DIAGNOSIS — Z833 Family history of diabetes mellitus: Secondary | ICD-10-CM | POA: Diagnosis not present

## 2024-06-21 DIAGNOSIS — Z8041 Family history of malignant neoplasm of ovary: Secondary | ICD-10-CM | POA: Diagnosis not present

## 2024-06-21 HISTORY — DX: Genetic susceptibility to other malignant neoplasm: Z15.09

## 2024-06-21 HISTORY — DX: Genetic susceptibility to other malignant neoplasm of digestive system: Z15.068

## 2024-06-21 HISTORY — DX: Other specified anxiety disorders: F41.8

## 2024-06-21 HISTORY — DX: Other constipation: K59.09

## 2024-06-21 HISTORY — DX: Unspecified hemorrhoids: K64.9

## 2024-06-21 HISTORY — DX: Leiomyoma of uterus, unspecified: D25.9

## 2024-06-21 HISTORY — DX: Insomnia, unspecified: G47.00

## 2024-06-21 HISTORY — DX: Personal history of other diseases of the circulatory system: Z86.79

## 2024-06-21 HISTORY — DX: Personal history of other medical treatment: Z92.89

## 2024-06-21 HISTORY — DX: Mixed incontinence: N39.46

## 2024-06-21 LAB — ABO/RH: ABO/RH(D): O POS

## 2024-06-21 LAB — POCT PREGNANCY, URINE: Preg Test, Ur: NEGATIVE

## 2024-06-21 SURGERY — HYSTERECTOMY, VAGINAL, LAPAROSCOPY-ASSISTED, WITH SALPINGECTOMY
Anesthesia: General

## 2024-06-21 MED ORDER — PROPOFOL 10 MG/ML IV BOLUS
INTRAVENOUS | Status: AC
  Filled 2024-06-21: qty 20

## 2024-06-21 MED ORDER — LACTATED RINGERS IV SOLN: INTRAVENOUS | Status: DC

## 2024-06-21 MED ORDER — DIPHENHYDRAMINE HCL 50 MG/ML IJ SOLN: 12.5000 mg | Freq: Four times a day (QID) | INTRAMUSCULAR | Status: DC | PRN

## 2024-06-21 MED ORDER — OXYCODONE HCL 5 MG PO TABS
5.0000 mg | ORAL_TABLET | ORAL | Status: DC | PRN
  Administered 2024-06-21: 10 mg via ORAL
  Filled 2024-06-21: qty 2

## 2024-06-21 MED ORDER — SIMETHICONE 80 MG PO CHEW
80.0000 mg | CHEWABLE_TABLET | Freq: Four times a day (QID) | ORAL | Status: DC | PRN
  Administered 2024-06-21: 80 mg via ORAL
  Filled 2024-06-21: qty 1

## 2024-06-21 MED ORDER — MINOXIDIL 2.5 MG PO TABS
1.2500 mg | ORAL_TABLET | Freq: Every day | ORAL | Status: DC
Start: 2024-06-22 — End: 2024-06-23
  Administered 2024-06-22 – 2024-06-23 (×2): 1.25 mg via ORAL
  Filled 2024-06-21 (×2): qty 0.5

## 2024-06-21 MED ORDER — CHLORHEXIDINE GLUCONATE 0.12 % MT SOLN: 15.0000 mL | Freq: Once | OROMUCOSAL | Status: AC

## 2024-06-21 MED ORDER — LIDOCAINE 2% (20 MG/ML) 5 ML SYRINGE
INTRAMUSCULAR | Status: DC | PRN
  Administered 2024-06-21: 70 mg via INTRAVENOUS

## 2024-06-21 MED ORDER — ROCURONIUM BROMIDE 10 MG/ML (PF) SYRINGE
PREFILLED_SYRINGE | INTRAVENOUS | Status: DC | PRN
  Administered 2024-06-21: 10 mg via INTRAVENOUS
  Administered 2024-06-21: 50 mg via INTRAVENOUS
  Administered 2024-06-21: 20 mg via INTRAVENOUS

## 2024-06-21 MED ORDER — BUPIVACAINE HCL (PF) 0.25 % IJ SOLN
INTRAMUSCULAR | Status: DC | PRN
  Administered 2024-06-21: 12 mL

## 2024-06-21 MED ORDER — BUPIVACAINE HCL (PF) 0.25 % IJ SOLN
INTRAMUSCULAR | Status: AC
  Filled 2024-06-21: qty 30

## 2024-06-21 MED ORDER — ACETAMINOPHEN 500 MG PO TABS
1000.0000 mg | ORAL_TABLET | Freq: Once | ORAL | Status: AC
  Administered 2024-06-21: 1000 mg via ORAL

## 2024-06-21 MED ORDER — SENNOSIDES-DOCUSATE SODIUM 8.6-50 MG PO TABS
1.0000 | ORAL_TABLET | Freq: Every evening | ORAL | Status: DC | PRN
Start: 2024-06-21 — End: 2024-06-23
  Administered 2024-06-23: 1 via ORAL
  Filled 2024-06-21: qty 1

## 2024-06-21 MED ORDER — MIDAZOLAM HCL 2 MG/2ML IJ SOLN
INTRAMUSCULAR | Status: AC
  Filled 2024-06-21: qty 2

## 2024-06-21 MED ORDER — SCOPOLAMINE 1 MG/3DAYS TD PT72
MEDICATED_PATCH | TRANSDERMAL | Status: AC
  Filled 2024-06-21: qty 1

## 2024-06-21 MED ORDER — FENTANYL CITRATE (PF) 250 MCG/5ML IJ SOLN
INTRAMUSCULAR | Status: DC | PRN
  Administered 2024-06-21: 100 ug via INTRAVENOUS
  Administered 2024-06-21 (×2): 50 ug via INTRAVENOUS

## 2024-06-21 MED ORDER — SODIUM CHLORIDE 0.9 % IR SOLN
Status: DC | PRN
Start: 2024-06-21 — End: 2024-06-21
  Administered 2024-06-21: 1000 mL

## 2024-06-21 MED ORDER — IBUPROFEN 600 MG PO TABS
600.0000 mg | ORAL_TABLET | Freq: Four times a day (QID) | ORAL | Status: DC | PRN
  Administered 2024-06-21 – 2024-06-22 (×2): 600 mg via ORAL
  Filled 2024-06-21 (×2): qty 1

## 2024-06-21 MED ORDER — SUGAMMADEX SODIUM 200 MG/2ML IV SOLN
INTRAVENOUS | Status: DC | PRN
  Administered 2024-06-21: 200 mg via INTRAVENOUS
  Administered 2024-06-21: 100 mg via INTRAVENOUS

## 2024-06-21 MED ORDER — DEXAMETHASONE SOD PHOSPHATE PF 10 MG/ML IJ SOLN
INTRAMUSCULAR | Status: DC | PRN
  Administered 2024-06-21: 10 mg via INTRAVENOUS

## 2024-06-21 MED ORDER — ONDANSETRON HCL 4 MG/2ML IJ SOLN: 4.0000 mg | Freq: Four times a day (QID) | INTRAMUSCULAR | Status: DC | PRN

## 2024-06-21 MED ORDER — ORAL CARE MOUTH RINSE: 15.0000 mL | Freq: Once | OROMUCOSAL | Status: AC

## 2024-06-21 MED ORDER — LIDOCAINE 2% (20 MG/ML) 5 ML SYRINGE
INTRAMUSCULAR | Status: AC
  Filled 2024-06-21: qty 5

## 2024-06-21 MED ORDER — SODIUM CHLORIDE 0.9 % IV SOLN: INTRAVENOUS | Status: DC | PRN

## 2024-06-21 MED ORDER — OXYCODONE HCL 5 MG PO TABS: 5.0000 mg | ORAL_TABLET | Freq: Once | ORAL | Status: DC | PRN

## 2024-06-21 MED ORDER — VASOPRESSIN 20 UNIT/ML IV SOLN
INTRAVENOUS | Status: AC
  Filled 2024-06-21: qty 1

## 2024-06-21 MED ORDER — ONDANSETRON HCL 4 MG/2ML IJ SOLN
INTRAMUSCULAR | Status: AC
  Filled 2024-06-21: qty 2

## 2024-06-21 MED ORDER — HEMOSTATIC AGENTS (NO CHARGE) OPTIME
TOPICAL | Status: DC | PRN
  Administered 2024-06-21: 1 via TOPICAL

## 2024-06-21 MED ORDER — ESTRADIOL 0.01 % VA CREA
TOPICAL_CREAM | VAGINAL | Status: AC
  Filled 2024-06-21: qty 42.5

## 2024-06-21 MED ORDER — DEXTROSE IN LACTATED RINGERS 5 % IV SOLN: INTRAVENOUS | Status: AC

## 2024-06-21 MED ORDER — ACETAMINOPHEN 10 MG/ML IV SOLN: 1000.0000 mg | Freq: Once | INTRAVENOUS | Status: DC | PRN

## 2024-06-21 MED ORDER — POVIDONE-IODINE 10 % EX SWAB
2.0000 | Freq: Once | CUTANEOUS | Status: AC
  Administered 2024-06-21: 2 via TOPICAL

## 2024-06-21 MED ORDER — KETOROLAC TROMETHAMINE 30 MG/ML IJ SOLN
INTRAMUSCULAR | Status: DC | PRN
  Administered 2024-06-21: 30 mg via INTRAVENOUS

## 2024-06-21 MED ORDER — DIPHENHYDRAMINE HCL 12.5 MG/5ML PO ELIX: 12.5000 mg | ORAL_SOLUTION | Freq: Four times a day (QID) | ORAL | Status: DC | PRN

## 2024-06-21 MED ORDER — HYDROMORPHONE HCL 1 MG/ML IJ SOLN
INTRAMUSCULAR | Status: AC
  Filled 2024-06-21: qty 1

## 2024-06-21 MED ORDER — 0.9 % SODIUM CHLORIDE (POUR BTL) OPTIME
TOPICAL | Status: DC | PRN
  Administered 2024-06-21: 2000 mL
  Administered 2024-06-21: 1000 mL

## 2024-06-21 MED ORDER — FENTANYL CITRATE (PF) 250 MCG/5ML IJ SOLN
INTRAMUSCULAR | Status: AC
  Filled 2024-06-21: qty 5

## 2024-06-21 MED ORDER — ACETAMINOPHEN 500 MG PO TABS
ORAL_TABLET | ORAL | Status: AC
Start: 2024-06-21 — End: 2024-06-21
  Filled 2024-06-21: qty 2

## 2024-06-21 MED ORDER — MIDAZOLAM HCL (PF) 2 MG/2ML IJ SOLN
INTRAMUSCULAR | Status: DC | PRN
  Administered 2024-06-21: 2 mg via INTRAVENOUS

## 2024-06-21 MED ORDER — DROPERIDOL 2.5 MG/ML IJ SOLN: 0.6250 mg | Freq: Once | INTRAMUSCULAR | Status: DC | PRN

## 2024-06-21 MED ORDER — PANTOPRAZOLE SODIUM 40 MG PO TBEC
40.0000 mg | DELAYED_RELEASE_TABLET | Freq: Every day | ORAL | Status: DC
  Administered 2024-06-21 – 2024-06-23 (×3): 40 mg via ORAL
  Filled 2024-06-21 (×3): qty 1

## 2024-06-21 MED ORDER — AMLODIPINE BESYLATE 10 MG PO TABS
10.0000 mg | ORAL_TABLET | Freq: Every day | ORAL | Status: DC
  Administered 2024-06-22: 10 mg via ORAL
  Filled 2024-06-21 (×2): qty 1

## 2024-06-21 MED ORDER — ROSUVASTATIN CALCIUM 5 MG PO TABS
10.0000 mg | ORAL_TABLET | Freq: Every day | ORAL | Status: DC
  Administered 2024-06-22 – 2024-06-23 (×2): 10 mg via ORAL
  Filled 2024-06-21 (×2): qty 2

## 2024-06-21 MED ORDER — ONDANSETRON HCL 4 MG/2ML IJ SOLN
INTRAMUSCULAR | Status: DC | PRN
  Administered 2024-06-21: 4 mg via INTRAVENOUS

## 2024-06-21 MED ORDER — HYDROMORPHONE HCL 1 MG/ML IJ SOLN
0.2500 mg | INTRAMUSCULAR | Status: DC | PRN
  Administered 2024-06-21: 0.5 mg via INTRAVENOUS
  Administered 2024-06-21: 0.25 mg via INTRAVENOUS

## 2024-06-21 MED ORDER — SODIUM CHLORIDE (PF) 0.9 % IJ SOLN
INTRAMUSCULAR | Status: AC
  Filled 2024-06-21: qty 100

## 2024-06-21 MED ORDER — FLUORESCEIN SODIUM 10 % IV SOLN
INTRAVENOUS | Status: DC | PRN
  Administered 2024-06-21: 500 mg via INTRAVENOUS

## 2024-06-21 MED ORDER — PHENYLEPHRINE 80 MCG/ML (10ML) SYRINGE FOR IV PUSH (FOR BLOOD PRESSURE SUPPORT)
PREFILLED_SYRINGE | INTRAVENOUS | Status: AC
  Filled 2024-06-21: qty 10

## 2024-06-21 MED ORDER — OXYCODONE HCL 5 MG/5ML PO SOLN: 5.0000 mg | Freq: Once | ORAL | Status: DC | PRN

## 2024-06-21 MED ORDER — PHENYLEPHRINE 80 MCG/ML (10ML) SYRINGE FOR IV PUSH (FOR BLOOD PRESSURE SUPPORT)
PREFILLED_SYRINGE | INTRAVENOUS | Status: DC | PRN
  Administered 2024-06-21 (×2): 80 ug via INTRAVENOUS

## 2024-06-21 MED ORDER — CEFAZOLIN SODIUM-DEXTROSE 2-4 GM/100ML-% IV SOLN
INTRAVENOUS | Status: AC
  Filled 2024-06-21: qty 100

## 2024-06-21 MED ORDER — FLUORESCEIN SODIUM 10 % IV SOLN
INTRAVENOUS | Status: AC
Start: 2024-06-21 — End: 2024-06-21
  Filled 2024-06-21: qty 5

## 2024-06-21 MED ORDER — NALOXONE HCL 0.4 MG/ML IJ SOLN: 0.4000 mg | INTRAMUSCULAR | Status: DC | PRN

## 2024-06-21 MED ORDER — CHLORHEXIDINE GLUCONATE 0.12 % MT SOLN
OROMUCOSAL | Status: AC
  Administered 2024-06-21: 15 mL via OROMUCOSAL
  Filled 2024-06-21: qty 15

## 2024-06-21 MED ORDER — ACETAMINOPHEN 325 MG PO TABS: 650.0000 mg | ORAL_TABLET | Freq: Four times a day (QID) | ORAL | Status: DC | PRN

## 2024-06-21 MED ORDER — SODIUM CHLORIDE 0.9% FLUSH: 9.0000 mL | INTRAVENOUS | Status: DC | PRN

## 2024-06-21 MED ORDER — HYDROMORPHONE 1 MG/ML IV SOLN
INTRAVENOUS | Status: DC
  Administered 2024-06-21: 30 mg via INTRAVENOUS
  Filled 2024-06-21: qty 30

## 2024-06-21 MED ORDER — ALBUMIN HUMAN 5 % IV SOLN: INTRAVENOUS | Status: DC | PRN

## 2024-06-21 MED ORDER — TRAZODONE HCL 50 MG PO TABS
25.0000 mg | ORAL_TABLET | Freq: Every evening | ORAL | Status: DC | PRN
  Administered 2024-06-21: 25 mg via ORAL
  Filled 2024-06-21: qty 1

## 2024-06-21 MED ORDER — ROCURONIUM BROMIDE 10 MG/ML (PF) SYRINGE
PREFILLED_SYRINGE | INTRAVENOUS | Status: AC
  Filled 2024-06-21: qty 10

## 2024-06-21 MED ORDER — MEPERIDINE HCL 25 MG/ML IJ SOLN
6.2500 mg | INTRAMUSCULAR | Status: DC | PRN
  Filled 2024-06-21: qty 1

## 2024-06-21 MED ORDER — SCOPOLAMINE 1 MG/3DAYS TD PT72
1.0000 | MEDICATED_PATCH | TRANSDERMAL | Status: DC
  Administered 2024-06-21: 1 mg via TRANSDERMAL

## 2024-06-21 MED ORDER — ONDANSETRON HCL 4 MG PO TABS: 4.0000 mg | ORAL_TABLET | Freq: Four times a day (QID) | ORAL | Status: DC | PRN

## 2024-06-21 MED ORDER — PROPOFOL 10 MG/ML IV BOLUS
INTRAVENOUS | Status: DC | PRN
  Administered 2024-06-21: 140 mg via INTRAVENOUS

## 2024-06-21 MED ORDER — BUPIVACAINE-EPINEPHRINE (PF) 0.25% -1:200000 IJ SOLN
INTRAMUSCULAR | Status: AC
  Filled 2024-06-21: qty 30

## 2024-06-21 MED ORDER — CEFAZOLIN SODIUM-DEXTROSE 2-4 GM/100ML-% IV SOLN
2.0000 g | INTRAVENOUS | Status: AC
  Administered 2024-06-21: 2 g via INTRAVENOUS

## 2024-06-21 MED ORDER — PHENYLEPHRINE HCL-NACL 20-0.9 MG/250ML-% IV SOLN
INTRAVENOUS | Status: DC | PRN
  Administered 2024-06-21: 20 ug/min via INTRAVENOUS

## 2024-06-21 SURGICAL SUPPLY — 60 items
BENZOIN TINCTURE PRP APPL 2/3 (GAUZE/BANDAGES/DRESSINGS) IMPLANT
CLSR STERI-STRIP ANTIMIC 1/2X4 (GAUZE/BANDAGES/DRESSINGS) IMPLANT
COVER BACK TABLE 60X90IN (DRAPES) ×1 IMPLANT
COVER MAYO STAND STRL (DRAPES) ×2 IMPLANT
DERMABOND ADVANCED .7 DNX12 (GAUZE/BANDAGES/DRESSINGS) ×1 IMPLANT
DRAPE SURG IRRIG POUCH 19X23 (DRAPES) ×1 IMPLANT
DRSG OPSITE POSTOP 4X10 (GAUZE/BANDAGES/DRESSINGS) ×1 IMPLANT
DRSG OPSITE POSTOP 4X12 (GAUZE/BANDAGES/DRESSINGS) IMPLANT
DRSG OPSITE POSTOP 4X8 (GAUZE/BANDAGES/DRESSINGS) IMPLANT
DURAPREP 26ML APPLICATOR (WOUND CARE) ×1 IMPLANT
ELECTRODE REM PT RTRN 9FT ADLT (ELECTROSURGICAL) ×1 IMPLANT
GAUZE 4X4 16PLY ~~LOC~~+RFID DBL (SPONGE) IMPLANT
GLOVE BIO SURGEON STRL SZ 6.5 (GLOVE) ×2 IMPLANT
GLOVE BIOGEL PI IND STRL 7.0 (GLOVE) ×4 IMPLANT
GLOVE SURG UNDER POLY LF SZ7 (GLOVE) ×4 IMPLANT
GOWN STRL REUS W/ TWL LRG LVL3 (GOWN DISPOSABLE) ×2 IMPLANT
HEMOSTAT ARISTA ABSORB 3G PWDR (HEMOSTASIS) IMPLANT
HIBICLENS CHG 4% 4OZ BTL (MISCELLANEOUS) ×1 IMPLANT
IRRIGATION SUCT STRKRFLW 2 WTP (MISCELLANEOUS) IMPLANT
IV 0.9% NACL 1000 ML (IV SOLUTION) IMPLANT
KIT PINK PAD W/HEAD ARM REST (MISCELLANEOUS) ×1 IMPLANT
KIT TURNOVER KIT B (KITS) ×1 IMPLANT
NDL HYPO 22X1.5 SAFETY MO (MISCELLANEOUS) IMPLANT
NDL MAYO CATGUT SZ4 TPR NDL (NEEDLE) ×1 IMPLANT
NEEDLE HYPO 22X1.5 SAFETY MO (MISCELLANEOUS) ×1 IMPLANT
NEEDLE MAYO CATGUT SZ4 (NEEDLE) IMPLANT
PACK LAVH (CUSTOM PROCEDURE TRAY) ×1 IMPLANT
PAD ARMBOARD POSITIONER FOAM (MISCELLANEOUS) ×1 IMPLANT
PAD OB MATERNITY 11 LF (PERSONAL CARE ITEMS) ×1 IMPLANT
POUCH LAPAROSCOPIC INSTRUMENT (MISCELLANEOUS) ×1 IMPLANT
SET CYSTO IRRIGATION (SET/KITS/TRAYS/PACK) ×1 IMPLANT
SET TUBE SMOKE EVAC HIGH FLOW (TUBING) ×1 IMPLANT
SLEEVE Z-THREAD 5X100MM (TROCAR) ×1 IMPLANT
SOLN 0.9% NACL POUR BTL 1000ML (IV SOLUTION) ×1 IMPLANT
SOLUTION ELECTROSURG ANTI STCK (MISCELLANEOUS) ×1 IMPLANT
SPONGE INTESTINAL PEANUT (DISPOSABLE) IMPLANT
SPONGE SURGIFOAM ABS GEL 12-7 (HEMOSTASIS) IMPLANT
SPONGE T-LAP 18X18 ~~LOC~~+RFID (SPONGE) ×2 IMPLANT
STAPLER SKIN PROX 35W (STAPLE) IMPLANT
STRIP CLOSURE SKIN 1/2X4 (GAUZE/BANDAGES/DRESSINGS) ×1 IMPLANT
SUT CHROMIC 0 CT 1 (SUTURE) ×1 IMPLANT
SUT CHROMIC 0 UR 5 27 (SUTURE) ×2 IMPLANT
SUT MNCRL AB 3-0 PS2 27 (SUTURE) ×2 IMPLANT
SUT MON AB 3-0 SH27 (SUTURE) ×1 IMPLANT
SUT PDS AB 0 CT1 27 (SUTURE) IMPLANT
SUT VIC AB 0 CT1 18XCR BRD8 (SUTURE) ×3 IMPLANT
SUT VIC AB 0 CT1 27XBRD ANBCTR (SUTURE) ×4 IMPLANT
SUT VIC AB 0 CT1 36 (SUTURE) IMPLANT
SUT VIC AB 2-0 SH 27XBRD (SUTURE) ×1 IMPLANT
SUT VICRYL 0 ENDOLOOP (SUTURE) IMPLANT
SUT VICRYL 0 TIES 12 18 (SUTURE) ×1 IMPLANT
SUT VICRYL 0 UR6 27IN ABS (SUTURE) ×2 IMPLANT
SYR BULB IRRIG 60ML STRL (SYRINGE) ×1 IMPLANT
SYR CONTROL 10ML LL (SYRINGE) IMPLANT
SYSTEM BAG RETRIEVAL 10MM (BASKET) IMPLANT
TOWEL GREEN STERILE FF (TOWEL DISPOSABLE) ×2 IMPLANT
TRAY FOLEY W/BAG SLVR 14FR (SET/KITS/TRAYS/PACK) ×1 IMPLANT
TROCAR BALLN 12MMX100 BLUNT (TROCAR) ×1 IMPLANT
UNDERPAD 30X36 HEAVY ABSORB (UNDERPADS AND DIAPERS) ×1 IMPLANT
WARMER LAPAROSCOPE (MISCELLANEOUS) ×1 IMPLANT

## 2024-06-21 NOTE — Progress Notes (Addendum)
 Pt arrived from PACU via stretcher. Pt transfer to bed. Pt is c/o pain 9/10, setting up PCA pump. No relief. Pt receive oxy 10mg . Pt call button in reach and pca pump button near. Pt husband is at bedside and given the tour of the floor.  1645: Pt call out still no relief from the pain medication, reached out to Dr. Armond via secure chat, waiting on response. Nurse explain to pt she need to wait and give the pain medication time to catch up, gave her heat pack to place on the incision. Will follow with patient pain and endorse to the ongoing nurse.

## 2024-06-21 NOTE — Op Note (Signed)
 Indications: symptomatic Fibroids Menorrhagia Dysmenorrhea Ovarian cyst  Pre-operative Diagnosis: see above  Post-operative Diagnosis: same  Operation:  Diagnostic laparoscopy.  Total abdominal hysterectomy.  Bilateral salpinectomy  Surgeon: IPOOJMI,WJPFJ A   Assistants: Jon Rummer MD  Anesthesia: General endotracheal anesthesia  ASA Class: per anesthesia  Procedure Details  The patient was seen in the Holding Room. The risks, benefits, complications, treatment options, and expected outcomes were discussed with the patient.  The patient concurred with the proposed plan, giving informed consent.  The site of surgery properly noted/marked. The patient was taken to Operating Room # 7 identified as Desiree Campbell and the procedure verified as a, LAVH, B salpingectomy and cystoscopy  poss  Total abdominal hysterectomy and cystoscopy.   A Time Out was held and the above information confirmed.  After induction of anesthesia, the patient was draped and prepped in the usual sterile manner. Pt was placed in supine position after anesthesia and draped and prepped in the usual sterile manner. Foley catheter was placed. Attention was then turned to the vagina.  The weighted retractor was placed in the vagina.  The anterior lip of the cervix was grasped with a single tooth tenaculum.  The uterus did sound to 9 cm.  The hulka was placed without difficulty.    A umbilical incision was made and carried through the subcutaneous tissue to the fascia.the peritoneum was opened.  The hasson was placed.  The abdomen was insufflated with co2 gas.  The camera was placed in the abdomen.  The uterus was 18 week size and irregular.Liver appeared normal.   A 5 mm incision was made in the LLQ .  A 5 mm port was placed in the LLQ. A probe was placed through this port and the uterus was placed through the port and I was unable to see the posterior culdesac because the uterus was so bulky.  I was unable to see  the anterior culdesac or co ring because of anterior fibroid obscurring the view.  Because of the size of the uterus and unable to see the posterior culdesac I decided to do an abdominal hysterectomy.  The umbilical fascial incision was closed. A pfannensteil incision was made with the knife on the skin 2 cm above the symphysis pubis.   Fascial incision was made and extended bilaterally. The rectus muscles were separated. The peritoneum was identified and entered. Peritoneal incision was extended longitudinally.  The above findings were noted. A belfour retractor with the extendor was placed and bowel was packed away from the surgical site.   The round ligaments were identified, cut, and ligated with 0-Vicryl. The anterior peritoneal reflection was incised and the bladder was dissected off the lower uterine segment. . The right utero-ovarian ligament and proximal fallopian tube were grasped, cut, and suture ligated with 0-Vicryl. The left utero-ovarian ligament and proximal fallopian tube were grasped, cut and suture ligated with 0-Vicryl. Hemostasis was observed. The fallopian tubes were clamped with kelly clamps and removed using metzenbaum scissors.  The mesosalpinx was made hemostatic with 0 vicryl suture.   The uterine vessels were skeletonized, then clamped, cut and suture ligated with 0-Vicryl suture. The fundus was removed using the knife to obtain better visualization.   Serial pedicles of the cardinal and utero-sacral ligaments were clamped, cut, and suture ligated with 0-Vicryl. Entrance was made into the vagina and the uterus removed. Vaginal cuff angle sutures were placed incorporating the utero-sacral ligaments for support. The vaginal cuff was then closed with a  running stitch of 0- Vicryl. Lavage was carried out until clear .There was some oozing from the vaginal cuff which was made hemostatic with figure of 8 o vicryl suture.  Arista was placed along the cuff as well.  Hemostasis was observed    Retractor and all packing was removed from the abdomen. The peritoneum was closed with 0 chromic.   The fascia was approximated with running sutures of 0-Vicryl. Lavage was again carried out.  Hemostasis was observed. The subcutaneous tissue was reapproximated with o plain in interrupted suture.   The umbilical and pfannesteil skin incisions  Were approximated 4-0 monocryl with subcuticular stitches.  T. All incisions reinforced with dermabond.    Cystoscopy was done and both ureters effluxed fluroscein.   The bladder was intact  Instrument, sponge, and needle counts were correct prior to abdominal closure and at the conclusion of the case.   Findings: 16week size fibroid uterus.  Irregular and bulky.  Normal appearing adnexa B  Estimated Blood Loss:  150 mL         Drains: none         Total IV Fluids:         Specimens: uterus , cervix and bilateral tubes.  Right ovarian cyst         Implants: none         Complications:  None; patient tolerated the procedure well.         Disposition: PACU - hemodynamically stable.         Condition: stable  Attending Attestation: I was present and scrubbed for the entire procedure.

## 2024-06-21 NOTE — Plan of Care (Signed)
  Problem: Coping: Goal: Level of anxiety will decrease Outcome: Not Progressing   Problem: Pain Managment: Goal: General experience of comfort will improve and/or be controlled Outcome: Not Progressing   Problem: Skin Integrity: Goal: Risk for impaired skin integrity will decrease Outcome: Progressing

## 2024-06-21 NOTE — Progress Notes (Signed)
 Pt show yellow MEWS at 1606 but vitals where taking in 10 minutes, see vitals flowsheet. Pt is green MEWS, alert and oriented. Charge RN is aware and notified. Endorse to ongoing nurse.

## 2024-06-21 NOTE — H&P (Signed)
 Desiree Campbell is an 43 y.o. female. Presenting for hysterectomy for symptomatic fibroids.  She has had menorrhagia and pain with bulkiy symptomsd for years.  She has tried mirena without success.  She now desires definitive treatment. It has been difficult to take her of her daily activitie due to the pain and bleeding from the fibroids.  Alll treatments for fibroids reviewed with the patient and she desires hysterectomy  Pertinent Gynecological History: Menses: flow is excessive with use of several  pads or tampons on heaviest days Bleeding: intermenstrual bleeding Contraception: IUD Sexually transmitted diseases: no past history Previous GYN Procedures:    Last mammogram: normal Date: 2025 Last pap: normal Date: July 2025 OB History: G2, P2   Menstrual History: Menarche age: 60 No LMP recorded. (Menstrual status: IUD).    Past Medical History:  Diagnosis Date   Chronic constipation    GI--- Dr CINDERELLA. [place]   Hemorrhoids    History of heart murmur in childhood    As a child. Echo done. Normal.   History of palpitations in adulthood 2022   evaluated by cardiology-- Dr. LELON. O'Neal;   palpitations in setting anxiety;   normal EKG;   recommendation regular exercise/ stress reduction strategies ;  pt released prn   History of positive PPD    06-17-2024  per pt  many yrs ago,  cxr clear   Hyperlipidemia    Hypertension    Insomnia    Leiomyoma of uterus    PMS2-related Lynch syndrome (HNPCC4)    pt had gentic testing   Situational anxiety    Urinary incontinence, mixed     Past Surgical History:  Procedure Laterality Date   CHALAZION EXCISION Right 1983   COLONOSCOPY WITH PROPOFOL  04/26/2023   dr y. federico   ESOPHAGOGASTRODUODENOSCOPY (EGD) WITH PROPOFOL  08/16/2023   dr y. federico   WISDOM TOOTH EXTRACTION  2015    Family History  Problem Relation Age of Onset   Diabetes Mother    Hyperlipidemia Mother    Miscarriages / Stillbirths Mother    Graves' disease  Mother    Hypertension Mother    Colon polyps Father 26   Hyperlipidemia Father    Arthritis Father    Hypertension Father    Hyperlipidemia Sister    Hypertension Sister    Breast cancer Paternal Aunt 60 - 59   Alcohol abuse Maternal Grandmother    Arthritis Paternal Grandmother    Breast cancer Paternal Grandmother 43 - 62   Hypertension Paternal Grandfather    Uterine cancer Cousin        paternal half-cousin   Ovarian cancer Other 50 - 69       maternal half-aunt   Colon cancer Neg Hx    Rectal cancer Neg Hx    Stomach cancer Neg Hx    Esophageal cancer Neg Hx     Social History:  reports that she has never smoked. She has never used smokeless tobacco. She reports current alcohol use. She reports that she does not use drugs.  Allergies: No Known Allergies  Medications Prior to Admission  Medication Sig Dispense Refill Last Dose/Taking   amLODipine  (NORVASC ) 10 MG tablet Take 1 tablet (10 mg total) by mouth daily. 90 tablet 2 06/21/2024 Morning   Cholecalciferol (VITAMIN D3) 50 MCG (2000 UT) capsule Take 2,000 Units by mouth daily.   06/20/2024   levonorgestrel (MIRENA) 20 MCG/DAY IUD 1 each by Intrauterine route once. 2021   Taking   minoxidil  (  LONITEN ) 2.5 MG tablet Take 1/2 tablets (1.25 mg total) by mouth daily. (Patient taking differently: Take 1.25 mg by mouth daily.) 90 tablet 2 06/21/2024 Morning   propranolol  (INDERAL ) 20 MG tablet Take 1 tablet (20 mg total) by mouth daily as needed. (Patient taking differently: Take 20 mg by mouth daily as needed (anxiety).) 90 tablet 3 Past Week   rosuvastatin  (CRESTOR ) 10 MG tablet Take 1 tablet (10 mg total) by mouth daily. 90 tablet 3 06/21/2024 Morning   traZODone  (DESYREL ) 50 MG tablet Take 0.5-1 tablets (25-50 mg total) by mouth at bedtime as needed for sleep. 30 tablet 1 Past Week    ROS  Blood pressure (!) 131/92, pulse 91, temperature 97.7 F (36.5 C), temperature source Oral, resp. rate 16, height 5' 8.5 (1.74 m),  weight 68.5 kg, SpO2 98%. Physical Exam Physical Examination: General appearance - alert, well appearing, and in no distress Heart - normal rate and regular rhythm Abdomen - soft, nontender, nondistended, no masses or organomegaly Able to palpate fibroids Pelvic - VULVA: normal appearing vulva with no masses, tenderness or lesions, VAGINA: normal appearing vagina with normal color and discharge, no lesions, CERVIX: normal appearing cervix without discharge or lesions, UTERUS: uterus is normal size, shape, consistency and nontender, enlarged to 14 week's size, irregular, mobile, ADNEXA: normal adnexa in size, nontender and no masses Rectal - normal rectal, no masses Neurological - alert, oriented, normal speech, no focal findings or movement disorder noted Extremities - Homan's sign negative bilaterally   Results for orders placed or performed during the hospital encounter of 06/21/24 (from the past 24 hours)  Pregnancy, urine POC     Status: None   Collection Time: 06/21/24  8:35 AM  Result Value Ref Range   Preg Test, Ur NEGATIVE NEGATIVE  ABO/Rh     Status: None (Preliminary result)   Collection Time: 06/21/24  9:28 AM  Result Value Ref Range   ABO/RH(D) PENDING    CBC    Component Value Date/Time   WBC 5.0 06/20/2024 1030   RBC 5.02 06/20/2024 1030   HGB 15.0 06/20/2024 1030   HCT 44.7 06/20/2024 1030   PLT 290 06/20/2024 1030   MCV 89.0 06/20/2024 1030   MCH 29.9 06/20/2024 1030   MCHC 33.6 06/20/2024 1030   RDW 12.9 06/20/2024 1030     No results found.  Assessment/Plan: Symptomatic fibroids EMBX WNL  US  sig for 14 week size uterus with fibroids.  Al treatments reviewed for fibroids such as obs, BC, ablation, COLOMBIA, myomectomy, myfembre and hysterectomy She has chosen hysterectomy She understand the risks are but not limited to bleeding, infection, damage to internal organs such as bowel and bladder    Jhania Etherington A Cande Mastropietro 06/21/2024, 9:54 AM

## 2024-06-21 NOTE — Transfer of Care (Signed)
 Immediate Anesthesia Transfer of Care Note  Patient: Desiree Campbell  Procedure(s) Performed: ABORTED, HYSTERECTOMY, VAGINAL, LAPAROSCOPY-ASSISTED, WITH BILATERAL SALPINGECTOMY CYSTOSCOPY RIGHT OVARIAN CYSECTOMY, OPEN OPEN, TOTAL ABDOMINAL HYSTERECTOMY WITH BILATERAL SALPINGECTOMY  Patient Location: PACU  Anesthesia Type:General  Level of Consciousness: drowsy and patient cooperative  Airway & Oxygen Therapy: Patient connected to face mask oxygen  Post-op Assessment: Report given to RN, Post -op Vital signs reviewed and stable, and Patient moving all extremities X 4  Post vital signs: Reviewed and stable  Last Vitals:  Vitals Value Taken Time  BP 103/89 06/21/24 13:50  Temp    Pulse 84 06/21/24 13:51  Resp 13 06/21/24 13:51  SpO2 99 % 06/21/24 13:51    Last Pain:  Vitals:   06/21/24 0846  TempSrc: Oral  PainSc: 0-No pain      Patients Stated Pain Goal: 4 (06/21/24 0846)  Complications: There were no known notable events for this encounter.

## 2024-06-21 NOTE — Anesthesia Procedure Notes (Signed)
 Procedure Name: Intubation Date/Time: 06/21/2024 10:25 AM  Performed by: Christopher Comings, CRNAPre-anesthesia Checklist: Patient identified, Emergency Drugs available, Suction available and Patient being monitored Patient Re-evaluated:Patient Re-evaluated prior to induction Oxygen Delivery Method: Circle system utilized Preoxygenation: Pre-oxygenation with 100% oxygen Induction Type: IV induction Ventilation: Mask ventilation without difficulty Laryngoscope Size: Mac and 4 Grade View: Grade I Tube type: Oral Tube size: 6.5 mm Number of attempts: 1 Airway Equipment and Method: Stylet and Oral airway Placement Confirmation: ETT inserted through vocal cords under direct vision, positive ETCO2 and breath sounds checked- equal and bilateral Secured at: 22 cm Tube secured with: Tape Dental Injury: Teeth and Oropharynx as per pre-operative assessment

## 2024-06-21 NOTE — Anesthesia Postprocedure Evaluation (Signed)
 Anesthesia Post Note  Patient: Desiree Campbell  Procedure(s) Performed: ABORTED, HYSTERECTOMY, VAGINAL, LAPAROSCOPY-ASSISTED, WITH BILATERAL SALPINGECTOMY CYSTOSCOPY RIGHT OVARIAN CYSECTOMY, OPEN OPEN, TOTAL ABDOMINAL HYSTERECTOMY WITH BILATERAL SALPINGECTOMY     Patient location during evaluation: PACU Anesthesia Type: General Level of consciousness: awake and alert Pain management: pain level controlled Vital Signs Assessment: post-procedure vital signs reviewed and stable Respiratory status: spontaneous breathing, nonlabored ventilation, respiratory function stable and patient connected to nasal cannula oxygen Cardiovascular status: blood pressure returned to baseline and stable Postop Assessment: no apparent nausea or vomiting Anesthetic complications: no   There were no known notable events for this encounter.  Last Vitals:  Vitals:   06/21/24 1445 06/21/24 1500  BP: 115/72 115/70  Pulse: 73 70  Resp: 14 14  Temp:  36.6 C  SpO2: 97% 99%    Last Pain:  Vitals:   06/21/24 0846  TempSrc: Oral  PainSc: 0-No pain                 [name]

## 2024-06-21 NOTE — Anesthesia Preprocedure Evaluation (Addendum)
 Anesthesia Evaluation  Patient identified by MRN, date of birth, ID band Patient awake    Reviewed: Allergy & Precautions, NPO status , Patient's Chart, lab work & pertinent test results  Airway Mallampati: I  TM Distance: >3 FB Neck ROM: Full    Dental  (+) Teeth Intact, Dental Advisory Given   Pulmonary neg pulmonary ROS   breath sounds clear to auscultation       Cardiovascular hypertension, Pt. on medications  Rhythm:Regular Rate:Normal     Neuro/Psych   Anxiety     negative neurological ROS     GI/Hepatic negative GI ROS, Neg liver ROS,,,  Endo/Other  negative endocrine ROS    Renal/GU negative Renal ROS     Musculoskeletal negative musculoskeletal ROS (+)    Abdominal   Peds  Hematology negative hematology ROS (+)   Anesthesia Other Findings   Reproductive/Obstetrics                              Anesthesia Physical Anesthesia Plan  ASA: 2  Anesthesia Plan: General   Post-op Pain Management: Tylenol PO (pre-op)* and Toradol IV (intra-op)*   Induction: Intravenous  PONV Risk Score and Plan: 4 or greater and Ondansetron , Dexamethasone, Midazolam and Scopolamine patch - Pre-op  Airway Management Planned: Oral ETT  Additional Equipment: None  Intra-op Plan:   Post-operative Plan: Extubation in OR  Informed Consent: I have reviewed the patients History and Physical, chart, labs and discussed the procedure including the risks, benefits and alternatives for the proposed anesthesia with the patient or authorized representative who has indicated his/her understanding and acceptance.     Dental advisory given  Plan Discussed with: CRNA  Anesthesia Plan Comments:          Anesthesia Quick Evaluation

## 2024-06-22 ENCOUNTER — Encounter (HOSPITAL_COMMUNITY): Payer: Self-pay | Admitting: Obstetrics and Gynecology

## 2024-06-22 LAB — COMPREHENSIVE METABOLIC PANEL WITH GFR
ALT: 14 U/L (ref 0–44)
AST: 22 U/L (ref 15–41)
Albumin: 3.5 g/dL (ref 3.5–5.0)
Alkaline Phosphatase: 32 U/L — ABNORMAL LOW (ref 38–126)
Anion gap: 8 (ref 5–15)
BUN: <5 mg/dL — ABNORMAL LOW (ref 6–20)
CO2: 25 mmol/L (ref 22–32)
Calcium: 8.7 mg/dL — ABNORMAL LOW (ref 8.9–10.3)
Chloride: 101 mmol/L (ref 98–111)
Creatinine, Ser: 0.84 mg/dL (ref 0.44–1.00)
GFR, Estimated: >60 mL/min (ref >60)
Glucose, Bld: 141 mg/dL — ABNORMAL HIGH (ref 70–99)
Potassium: 3.5 mmol/L (ref 3.5–5.1)
Sodium: 134 mmol/L — ABNORMAL LOW (ref 135–145)
Total Bilirubin: 0.8 mg/dL (ref 0.0–1.2)
Total Protein: 7 g/dL (ref 6.5–8.1)

## 2024-06-22 LAB — CBC
HCT: 31.1 % — ABNORMAL LOW (ref 36.0–46.0)
Hemoglobin: 10.1 g/dL — ABNORMAL LOW (ref 12.0–15.0)
MCH: 30.2 pg (ref 26.0–34.0)
MCHC: 32.5 g/dL (ref 30.0–36.0)
MCV: 93.1 fL (ref 80.0–100.0)
Platelets: 195 K/uL (ref 150–400)
RBC: 3.34 MIL/uL — ABNORMAL LOW (ref 3.87–5.11)
RDW: 13.2 % (ref 11.5–15.5)
WBC: 6.6 K/uL (ref 4.0–10.5)
nRBC: 0 % (ref 0.0–0.2)

## 2024-06-22 LAB — GLUCOSE, CAPILLARY: Glucose-Capillary: 139 mg/dL — ABNORMAL HIGH (ref 70–99)

## 2024-06-22 MED ORDER — METOCLOPRAMIDE HCL 5 MG PO TABS
10.0000 mg | ORAL_TABLET | Freq: Three times a day (TID) | ORAL | Status: DC
  Administered 2024-06-22 – 2024-06-23 (×4): 10 mg via ORAL
  Filled 2024-06-22 (×5): qty 2

## 2024-06-22 MED ORDER — ACETAMINOPHEN 325 MG PO TABS
650.0000 mg | ORAL_TABLET | Freq: Four times a day (QID) | ORAL | Status: AC
  Administered 2024-06-22 (×2): 650 mg via ORAL
  Filled 2024-06-22 (×2): qty 2

## 2024-06-22 MED ORDER — OXYCODONE HCL 5 MG PO TABS
5.0000 mg | ORAL_TABLET | ORAL | Status: AC
  Administered 2024-06-22 – 2024-06-23 (×2): 10 mg via ORAL
  Filled 2024-06-22 (×2): qty 2

## 2024-06-22 MED ORDER — IBUPROFEN 600 MG PO TABS
600.0000 mg | ORAL_TABLET | Freq: Four times a day (QID) | ORAL | Status: DC
Start: 2024-06-22 — End: 2024-06-23
  Administered 2024-06-22 – 2024-06-23 (×4): 600 mg via ORAL
  Filled 2024-06-22 (×5): qty 1

## 2024-06-22 NOTE — Progress Notes (Signed)
 Rechecked patient's BP 99/71, PULSE 56. Patient states pain is 2 on scale 0-10 Held 1400 dose of Ibuprofen. Restarted IV fluids per continuous orders that will expire at 1700. Will recheck BP. Patient states, I would like to continue to hold pain meds because my pain is still a 2 on a scale 0-10 and the abdominal binder is helping. Called Dr. Ovid Dillard at [phone] to notifiy of patient's BP and medications held. Per Dr. Armond hold BP medication but don't hold pain meds if patient wants the medication. Informed Dr. Armond that patient wants to hold pain medications at this time. I will reassess at 1800  06/22/24 1344  Vitals  Temp 98.1 F (36.7 C)  BP (!) 84/50  MAP (mmHg) (!) 60  BP Location Right Arm  BP Method Automatic  Patient Position (if appropriate) Lying  Pulse Rate (!) 58  Pulse Rate Source Monitor  Resp 18  MEWS COLOR  MEWS Score Color Green  Oxygen Therapy  SpO2 100 %  O2 Device Room Air  MEWS Score  MEWS Temp 0  MEWS Systolic 1  MEWS Pulse 0  MEWS RR 0  MEWS LOC 0  MEWS Score 1

## 2024-06-22 NOTE — Plan of Care (Signed)

## 2024-06-22 NOTE — Progress Notes (Signed)
 1 Day Post-Op Procedure(s) (LRB): ABORTED, HYSTERECTOMY, VAGINAL, LAPAROSCOPY-ASSISTED, WITH BILATERAL SALPINGECTOMY (N/A) CYSTOSCOPY (N/A) RIGHT OVARIAN CYSECTOMY, OPEN (N/A) OPEN, TOTAL ABDOMINAL HYSTERECTOMY WITH BILATERAL SALPINGECTOMY (N/A)  Subjective: Patient reports incisional pain, tolerating PO, and + flatus.    Objective: I have reviewed patient's vital signs, intake and output, and labs.  General: alert and cooperative Resp: clear to auscultation bilaterally Cardio: regular rate and rhythm GI: soft, non-tender; bowel sounds normal; no masses,  no organomegaly and incision: clean, dry, and intact Extremities: Homans sign is negative, no sign of DVT Vaginal Bleeding: minimal  Assessment: s/p Procedure(s): ABORTED, HYSTERECTOMY, VAGINAL, LAPAROSCOPY-ASSISTED, WITH BILATERAL SALPINGECTOMY (N/A) CYSTOSCOPY (N/A) RIGHT OVARIAN CYSECTOMY, OPEN (N/A) OPEN, TOTAL ABDOMINAL HYSTERECTOMY WITH BILATERAL SALPINGECTOMY (N/A): stable  Plan: Advance diet Encourage ambulation Advance to PO medication Discontinue IV fluids Dc foley  LOS: 1 day    Ovid DELENA All, MD 06/22/2024, 11:04 AM

## 2024-06-22 NOTE — Progress Notes (Signed)
 Foley catheter removed, 1400 cc emptied from foley bag. Patient tolerated well. PCA pump discontinued per MD orders. Patient due to void. Patient ambulating independently in the room. Patient states pain is 2 on scale of 0-10. Wasted remaining PCA Hydromorphone in syringe of 25.5 mg witnessed by Avelina Aschoff, RN

## 2024-06-22 NOTE — Plan of Care (Signed)

## 2024-06-22 NOTE — Progress Notes (Signed)
 Patient states. My pain is 3 and I don't want anymore oxycodone right now. I no longer felt dizzy when I got up to go to the restroom earlier. Patient agreed to take the 1700 scheduled medications (Reglan, Ibuprofen and Tylenol). I informed patient that the next scheduled dose of oxycodone is at 2000 and she may want to take it because her BP has improved. Also, to help keep her pain under control. This nurse will communicate all updates with oncoming nurse in shift report.   06/22/24 1750  Vitals  Temp 98.4 F (36.9 C)  Temp Source Oral  BP 102/62  MAP (mmHg) 74  BP Location Right Arm  BP Method Automatic  Patient Position (if appropriate) Lying  Pulse Rate 61  Pulse Rate Source Dinamap  Resp 17  Level of Consciousness  Level of Consciousness Alert  MEWS COLOR  MEWS Score Color Green  Oxygen Therapy  SpO2 100 %  O2 Device Room Air  MEWS Score  MEWS Temp 0  MEWS Systolic 0  MEWS Pulse 0  MEWS RR 0  MEWS LOC 0  MEWS Score 0

## 2024-06-23 MED ORDER — ONDANSETRON HCL 4 MG PO TABS
4.0000 mg | ORAL_TABLET | Freq: Four times a day (QID) | ORAL | 0 refills | Status: AC | PRN
  Filled 2024-06-23: qty 20, 5d supply, fill #0

## 2024-06-23 MED ORDER — METOCLOPRAMIDE HCL 10 MG PO TABS
10.0000 mg | ORAL_TABLET | Freq: Three times a day (TID) | ORAL | 0 refills | Status: AC
  Filled 2024-06-23: qty 20, 5d supply, fill #0

## 2024-06-23 MED ORDER — OXYCODONE HCL 5 MG PO TABS
5.0000 mg | ORAL_TABLET | Freq: Four times a day (QID) | ORAL | 0 refills | Status: AC | PRN
  Filled 2024-06-23: qty 30, 8d supply, fill #0

## 2024-06-23 MED ORDER — SENNOSIDES-DOCUSATE SODIUM 8.6-50 MG PO TABS
1.0000 | ORAL_TABLET | Freq: Every evening | ORAL | 0 refills | Status: AC | PRN
  Filled 2024-06-23: qty 30, 30d supply, fill #0

## 2024-06-23 MED ORDER — IBUPROFEN 600 MG PO TABS
600.0000 mg | ORAL_TABLET | Freq: Four times a day (QID) | ORAL | 0 refills | Status: AC
  Filled 2024-06-23: qty 30, 8d supply, fill #0

## 2024-06-23 MED ORDER — SIMETHICONE 80 MG PO CHEW
80.0000 mg | CHEWABLE_TABLET | Freq: Four times a day (QID) | ORAL | 0 refills | Status: AC | PRN
  Filled 2024-06-23: qty 30, 8d supply, fill #0

## 2024-06-23 NOTE — Plan of Care (Signed)

## 2024-06-23 NOTE — Progress Notes (Signed)
 Per call to patient at [phone] to notify that Dr. [name] sent prescriptions to [facility] pharmacy patient request. Per Dr. All provided patient with MD's personal cell phone number as well.

## 2024-06-23 NOTE — Discharge Summary (Signed)
 Physician Discharge Summary  Patient ID: Desiree Campbell MRN: [id number] DOB/AGE: February 12, 1981 43 y.o.  Admit date: 06/21/2024 Discharge date: 06/23/2024  Admission Diagnoses: symptomatic Fibroids  Discharge Diagnoses:  Principal Problem:   S/P hysterectomy Active Problems:   S/P TAH (total abdominal hysterectomy)   Discharged Condition: good  Hospital Course: P tunderwent a Dx L/s  TAH, right ovarian cystectomy and cysto.  She is tolerating PO and pain is controlled.  She has benfitted from her hospital stay   Consults: None  Significant Diagnostic Studies: labs:  and pathology  Treatments: IV hydration and surgery: as above  Discharge Exam: Blood pressure 119/83, pulse 68, temperature 97.7 F (36.5 C), temperature source Oral, resp. rate 18, height 5' 8.5 (1.74 m), weight 68.5 kg, SpO2 100%. General appearance: alert and cooperative Resp: clear to auscultation bilaterally GI: soft, non-tender; bowel sounds normal; no masses,  no organomegaly and incision CDI Pelvic: minimal VB Extremities: Homans sign is negative, no sign of DVT  Disposition: Discharge disposition: 01-Home or Self Care        Allergies as of 06/23/2024   No Known Allergies      Medication List     STOP taking these medications    levonorgestrel 20 MCG/DAY Iud Commonly known as: MIRENA       TAKE these medications    amLODipine  10 MG tablet Commonly known as: NORVASC  Take 1 tablet (10 mg total) by mouth daily.   ibuprofen 600 MG tablet Commonly known as: ADVIL Take 1 tablet (600 mg total) by mouth 4 (four) times daily.   metoCLOPramide 10 MG tablet Commonly known as: REGLAN Take 1 tablet (10 mg total) by mouth 4 (four) times daily -  before meals and at bedtime for 5 days.   minoxidil  2.5 MG tablet Commonly known as: LONITEN  Take 1/2 tablets (1.25 mg total) by mouth daily.   ondansetron  4 MG tablet Commonly known as: ZOFRAN  Take 1 tablet (4 mg total) by mouth  every 6 (six) hours as needed for nausea.   oxyCODONE 5 MG immediate release tablet Commonly known as: Oxy IR/ROXICODONE Take 1 tablet (5 mg total) by mouth every 6 (six) hours as needed for up to 8 days for moderate pain (pain score 4-6).   propranolol  20 MG tablet Commonly known as: INDERAL  Take 1 tablet (20 mg total) by mouth daily as needed. What changed: reasons to take this   rosuvastatin  10 MG tablet Commonly known as: Crestor  Take 1 tablet (10 mg total) by mouth daily.   senna-docusate 8.6-50 MG tablet Commonly known as: Senokot-S Take 1 tablet by mouth at bedtime as needed for mild constipation.   simethicone 80 MG chewable tablet Commonly known as: MYLICON Chew 1 tablet (80 mg total) by mouth 4 (four) times daily as needed for flatulence.   traZODone  50 MG tablet Commonly known as: DESYREL  Take 0.5-1 tablets (25-50 mg total) by mouth at bedtime as needed for sleep.   Vitamin D3 50 MCG (2000 UT) capsule Take 2,000 Units by mouth daily.        Follow-up Information     Armond Cape, MD Follow up on 06/25/2024.   Specialty: Obstetrics and Gynecology Why: [facility] appt Contact information: [address] [place] [address] [phone]                Check BP every day.  If low hld meds  Signed: Markeise Mathews A Roshawna Colclasure 06/23/2024, 1:59 PM

## 2024-06-23 NOTE — Progress Notes (Signed)
 Deland Donate Ben-Davies to be D/C'd  per MD order.  Discussed with the patient and all questions fully answered.  VSS, Skin clean, dry and intact without evidence of skin break down, no evidence of skin tears noted.  IV catheter discontinued intact. Site without signs and symptoms of complications. Dressing and pressure applied.  An After Visit Summary was printed and given to the patient. Patient instructed to pick up prescriptions from preferred pharmacy.  D/c education completed with patient/family including follow up instructions, medication list, d/c activities limitations if indicated, with other d/c instructions as indicated by MD - patient able to verbalize understanding, all questions fully answered.   Patient instructed to return to ED, call 911, or call MD for any changes in condition.   Patient did not want a wheelchair and opt to walk independently with spouse to private vehicle to be discharged home.

## 2024-06-23 NOTE — Plan of Care (Signed)
  Problem: Clinical Measurements: Goal: Ability to maintain clinical measurements within normal limits will improve Outcome: Progressing   Problem: Nutrition: Goal: Adequate nutrition will be maintained Outcome: Progressing   Problem: Elimination: Goal: Will not experience complications related to urinary retention Outcome: Progressing

## 2024-06-24 ENCOUNTER — Other Ambulatory Visit (HOSPITAL_COMMUNITY): Payer: Self-pay

## 2024-06-24 LAB — SURGICAL PATHOLOGY

## 2024-07-12 ENCOUNTER — Other Ambulatory Visit: Payer: Self-pay | Admitting: Family Medicine

## 2024-07-12 DIAGNOSIS — I10 Essential (primary) hypertension: Secondary | ICD-10-CM

## 2024-07-12 DIAGNOSIS — E785 Hyperlipidemia, unspecified: Secondary | ICD-10-CM

## 2024-07-15 ENCOUNTER — Other Ambulatory Visit: Payer: Self-pay

## 2024-07-15 ENCOUNTER — Other Ambulatory Visit (HOSPITAL_COMMUNITY): Payer: Self-pay

## 2024-07-15 MED ORDER — AMLODIPINE BESYLATE 10 MG PO TABS
10.0000 mg | ORAL_TABLET | Freq: Every day | ORAL | 2 refills | Status: AC
  Filled 2024-07-15: qty 90, 90d supply, fill #0
  Filled 2024-08-26: qty 90, 90d supply, fill #1

## 2024-07-15 MED ORDER — ROSUVASTATIN CALCIUM 10 MG PO TABS
10.0000 mg | ORAL_TABLET | Freq: Every day | ORAL | 3 refills | Status: AC
Start: 2024-07-15 — End: ?
  Filled 2024-07-15: qty 90, 90d supply, fill #0
  Filled 2024-08-26: qty 90, 90d supply, fill #1

## 2024-08-26 ENCOUNTER — Other Ambulatory Visit (HOSPITAL_BASED_OUTPATIENT_CLINIC_OR_DEPARTMENT_OTHER): Payer: Self-pay

## 2024-08-27 ENCOUNTER — Other Ambulatory Visit: Payer: Self-pay
